# Patient Record
Sex: Female | Born: 1955 | Race: White | Hispanic: No | Marital: Married | Smoking: Never smoker
Health system: Southern US, Community
[De-identification: ages and names within clinical notes are randomized; demographics above are authoritative.]

## PROBLEM LIST (undated history)

## (undated) DIAGNOSIS — E785 Hyperlipidemia, unspecified: Secondary | ICD-10-CM

## (undated) DIAGNOSIS — R7303 Prediabetes: Secondary | ICD-10-CM

## (undated) DIAGNOSIS — F99 Mental disorder, not otherwise specified: Secondary | ICD-10-CM

## (undated) HISTORY — PX: TONSILLECTOMY: SUR1361

## (undated) HISTORY — DX: Prediabetes: R73.03

## (undated) HISTORY — DX: Mental disorder, not otherwise specified: F99

## (undated) HISTORY — DX: Hyperlipidemia, unspecified: E78.5

---

## 2005-05-01 ENCOUNTER — Ambulatory Visit: Payer: Self-pay | Admitting: Internal Medicine

## 2005-05-15 ENCOUNTER — Encounter: Payer: Self-pay | Admitting: Internal Medicine

## 2005-05-15 ENCOUNTER — Other Ambulatory Visit: Admission: RE | Admit: 2005-05-15 | Discharge: 2005-05-15 | Payer: Self-pay | Admitting: Internal Medicine

## 2005-05-15 ENCOUNTER — Ambulatory Visit: Payer: Self-pay | Admitting: Internal Medicine

## 2005-11-10 ENCOUNTER — Ambulatory Visit: Payer: Self-pay | Admitting: Internal Medicine

## 2005-12-01 ENCOUNTER — Ambulatory Visit: Payer: Self-pay | Admitting: Internal Medicine

## 2006-03-10 ENCOUNTER — Ambulatory Visit: Payer: Self-pay | Admitting: Internal Medicine

## 2006-07-13 ENCOUNTER — Ambulatory Visit: Payer: Self-pay | Admitting: Internal Medicine

## 2006-07-13 LAB — CONVERTED CEMR LAB
BUN: 9 mg/dL (ref 6–23)
Basophils Absolute: 0.1 10*3/uL (ref 0.0–0.1)
Chloride: 104 meq/L (ref 96–112)
Cholesterol: 233 mg/dL (ref 0–200)
Creatinine, Ser: 0.6 mg/dL (ref 0.4–1.2)
GFR calc Af Amer: 136 mL/min
HCT: 36.1 % (ref 36.0–46.0)
Hemoglobin: 11.9 g/dL — ABNORMAL LOW (ref 12.0–15.0)
MCV: 81.8 fL (ref 78.0–100.0)
Monocytes Absolute: 0.7 10*3/uL (ref 0.2–0.7)
Neutro Abs: 4.5 10*3/uL (ref 1.4–7.7)
Neutrophils Relative %: 62.2 % (ref 43.0–77.0)
Potassium: 3.9 meq/L (ref 3.5–5.1)
RBC: 4.41 M/uL (ref 3.87–5.11)
RDW: 14.8 % — ABNORMAL HIGH (ref 11.5–14.6)
Total CHOL/HDL Ratio: 4.6
WBC: 7.2 10*3/uL (ref 4.5–10.5)

## 2006-07-20 ENCOUNTER — Ambulatory Visit: Payer: Self-pay | Admitting: Internal Medicine

## 2007-08-15 ENCOUNTER — Telehealth: Payer: Self-pay | Admitting: Internal Medicine

## 2007-09-05 ENCOUNTER — Ambulatory Visit: Payer: Self-pay | Admitting: Internal Medicine

## 2007-09-05 DIAGNOSIS — R4182 Altered mental status, unspecified: Secondary | ICD-10-CM

## 2007-09-05 DIAGNOSIS — F329 Major depressive disorder, single episode, unspecified: Secondary | ICD-10-CM

## 2007-09-05 DIAGNOSIS — E785 Hyperlipidemia, unspecified: Secondary | ICD-10-CM | POA: Insufficient documentation

## 2007-09-05 LAB — CONVERTED CEMR LAB
Basophils Relative: 0.7 % (ref 0.0–1.0)
CO2: 27 meq/L (ref 19–32)
Creatinine, Ser: 0.6 mg/dL (ref 0.4–1.2)
Eosinophils Absolute: 0.2 10*3/uL (ref 0.0–0.6)
Eosinophils Relative: 2.3 % (ref 0.0–5.0)
GFR calc Af Amer: 135 mL/min
GFR calc non Af Amer: 112 mL/min
HCT: 39.7 % (ref 36.0–46.0)
Lymphocytes Relative: 25.8 % (ref 12.0–46.0)
MCHC: 32.9 g/dL (ref 30.0–36.0)
MCV: 85.2 fL (ref 78.0–100.0)
Potassium: 4 meq/L (ref 3.5–5.1)
RBC: 4.66 M/uL (ref 3.87–5.11)
Sodium: 141 meq/L (ref 135–145)

## 2008-01-30 ENCOUNTER — Ambulatory Visit: Payer: Self-pay | Admitting: Internal Medicine

## 2008-01-30 DIAGNOSIS — M25559 Pain in unspecified hip: Secondary | ICD-10-CM

## 2008-01-30 DIAGNOSIS — M25569 Pain in unspecified knee: Secondary | ICD-10-CM

## 2008-02-14 ENCOUNTER — Encounter: Payer: Self-pay | Admitting: Internal Medicine

## 2010-01-31 ENCOUNTER — Encounter: Admission: RE | Admit: 2010-01-31 | Discharge: 2010-01-31 | Payer: Self-pay | Admitting: Internal Medicine

## 2010-12-20 LAB — HM MAMMOGRAPHY: HM Mammogram: NORMAL

## 2010-12-20 LAB — HM COLONOSCOPY: HM Colonoscopy: NORMAL

## 2011-04-02 ENCOUNTER — Ambulatory Visit: Payer: Self-pay | Admitting: Unknown Physician Specialty

## 2011-10-08 ENCOUNTER — Encounter: Payer: Self-pay | Admitting: Obstetrics and Gynecology

## 2011-10-13 ENCOUNTER — Encounter: Payer: Self-pay | Admitting: Obstetrics and Gynecology

## 2011-10-13 ENCOUNTER — Ambulatory Visit (INDEPENDENT_AMBULATORY_CARE_PROVIDER_SITE_OTHER): Payer: No Typology Code available for payment source | Admitting: Obstetrics and Gynecology

## 2011-10-13 VITALS — BP 120/78 | Ht 66.0 in | Wt 175.0 lb

## 2011-10-13 DIAGNOSIS — N950 Postmenopausal bleeding: Secondary | ICD-10-CM

## 2011-10-13 NOTE — Progress Notes (Signed)
Subjective:    Shelly Kim is a 56 y.o. female G3P3 who presents for PMB. Menopause in 2010. Used Hormone pellets until 11/2010.  Spotting in December, January, February followed by 7 days of spotting and 6 days of menstrual-type bleeding 09/14/11 to 09/20/11.   The following portions of the patient's history were reviewed and updated as appropriate: allergies, current medications, past family history, past medical history, past social history, past surgical history and problem list.  Review of Systems A comprehensive review of systems was negative. Gastrointestinal:No change in bowel habits, no abdominal pain, no rectal bleeding Genitourinary:negative for dysuria, frequency, hematuria, nocturia and urinary incontinence    Objective:     BP 120/78  Ht 5\' 6"  (1.676 m)  Wt 175 lb (79.379 kg)  BMI 28.25 kg/m2  LMP 09/14/2011  Weight:  Wt Readings from Last 1 Encounters:  10/13/11 175 lb (79.379 kg)     BMI: Body mass index is 28.25 kg/(m^2). General Appearance: Alert, appropriate appearance for age. No acute distress HEENT: Grossly normal Neck / Thyroid: Supple, no masses, nodes or enlargement Lungs: clear to auscultation bilaterally Back: No CVA tenderness Breast Exam: No masses or nodes.No dimpling, nipple retraction or discharge. Cardiovascular: Regular rate and rhythm. S1, S2, no murmur Gastrointestinal: Soft, non-tender, no masses or organomegaly Pelvic Exam: Vulva and vagina appear normal. Bimanual exam reveals normal uterus and adnexa. Rectovaginal: not indicated Lymphatic Exam: Non-palpable nodes in neck, clavicular, axillary, or inguinal regions Skin: no rash or abnormalities Neurologic: Normal gait and speech, no tremor  Psychiatric: Alert and oriented, appropriate affect.    Urinalysis:Not done  ENDOMETRIAL BIOPSY PERFORMED DURING VISIT  INDICATION: post menopausal bleeding  LMP: 09/14/11 UPT: N/A  Ultrasound findings: TO BE SCHEDULED   Cervix prepped with  betadine Tenaculum applied to anterior lip of the cervix: yes Uterus sounded at  8  cm Endometrial biopsy easily performed with Pipelle Well tolerated  Specimen appropriately identified and sent to pathology       Assessment:    Post-menopausal vaginal bleeding    Plan:    Biopsy RESULTS PENDING. Pelvic ultrasound.   Follow-up:  WITH ULTRASOUND

## 2011-10-13 NOTE — Progress Notes (Signed)
The patient is not currently taking hormone replacement therapy The patient  is not taking a Calcium supplement. Post-menopausal bleeding:yes:  March 18,2013  Last Pap: was normal October  2012 Last mammogram: was normal September  2011 Last DEXA scan : never  Last colonoscopy:was normal January 2013  Urinary symptoms: none Normal bowel movements: Yes Reports abuse at home: No:

## 2011-10-21 ENCOUNTER — Encounter: Payer: Self-pay | Admitting: Obstetrics and Gynecology

## 2011-10-29 ENCOUNTER — Other Ambulatory Visit: Payer: No Typology Code available for payment source

## 2011-10-29 ENCOUNTER — Encounter: Payer: No Typology Code available for payment source | Admitting: Obstetrics and Gynecology

## 2011-11-03 ENCOUNTER — Ambulatory Visit (INDEPENDENT_AMBULATORY_CARE_PROVIDER_SITE_OTHER): Payer: No Typology Code available for payment source

## 2011-11-03 ENCOUNTER — Ambulatory Visit (INDEPENDENT_AMBULATORY_CARE_PROVIDER_SITE_OTHER): Payer: No Typology Code available for payment source | Admitting: Obstetrics and Gynecology

## 2011-11-03 ENCOUNTER — Other Ambulatory Visit: Payer: Self-pay | Admitting: Obstetrics and Gynecology

## 2011-11-03 VITALS — BP 106/70

## 2011-11-03 DIAGNOSIS — N950 Postmenopausal bleeding: Secondary | ICD-10-CM

## 2011-11-03 MED ORDER — MEDROXYPROGESTERONE ACETATE 10 MG PO TABS: 10.0000 mg | ORAL_TABLET | Freq: Every day | ORAL | Status: DC

## 2011-11-03 NOTE — Progress Notes (Signed)
Follow-up PMB. Discontinued hormonal pellets June 2012 Endometrial biopsy normal Ultrasound today: uterus 5.5x9.8x5.8 cm               EM 1.16 cm        Ovaries normal  A/P:  PMB with normal biopsy but thickened EM Provera 10 mg daily x 14 days then schedule sonohyst June 2013

## 2011-12-03 ENCOUNTER — Ambulatory Visit (INDEPENDENT_AMBULATORY_CARE_PROVIDER_SITE_OTHER): Payer: No Typology Code available for payment source | Admitting: Obstetrics and Gynecology

## 2011-12-03 ENCOUNTER — Encounter (INDEPENDENT_AMBULATORY_CARE_PROVIDER_SITE_OTHER): Payer: No Typology Code available for payment source

## 2011-12-03 ENCOUNTER — Other Ambulatory Visit: Payer: Self-pay | Admitting: Obstetrics and Gynecology

## 2011-12-03 ENCOUNTER — Encounter: Payer: Self-pay | Admitting: Obstetrics and Gynecology

## 2011-12-03 VITALS — BP 112/72

## 2011-12-03 DIAGNOSIS — N950 Postmenopausal bleeding: Secondary | ICD-10-CM

## 2011-12-03 DIAGNOSIS — N938 Other specified abnormal uterine and vaginal bleeding: Secondary | ICD-10-CM

## 2011-12-03 DIAGNOSIS — N949 Unspecified condition associated with female genital organs and menstrual cycle: Secondary | ICD-10-CM

## 2011-12-03 MED ORDER — PROGESTERONE MICRONIZED 200 MG PO CAPS: ORAL_CAPSULE | ORAL | Status: DC

## 2011-12-03 NOTE — Progress Notes (Signed)
Sonohysterogram procedure  Consent signed Pelvic ultrasound: Normal uterus, normal ovaries, endometrium measuring 0.58 cm  Indication:follow-up PMB Patient completed 14 days of Provera and started bleeding on D12 for 10 days. Reports moodiness during Provera  Prepping with Betadine Tenaculum used: no 3-D sweep obtained easily  Findings: normal with EM 0.58 cm  Previous laboratory results reviewed:yes  Plan: prometrium 200 mg x 12 days every 60 days PRN          F-U AEX   Silverio Lay MD

## 2011-12-03 NOTE — Progress Notes (Signed)
Sonohyst:  Endometrium:  0.598 cm  AV uterus  No focal lesions identified  3D imaging confirmation

## 2011-12-20 LAB — HM PAP SMEAR: HM Pap smear: NORMAL

## 2012-07-27 ENCOUNTER — Ambulatory Visit: Payer: No Typology Code available for payment source | Admitting: Obstetrics and Gynecology

## 2012-12-19 ENCOUNTER — Ambulatory Visit (INDEPENDENT_AMBULATORY_CARE_PROVIDER_SITE_OTHER): Payer: No Typology Code available for payment source | Admitting: Internal Medicine

## 2012-12-19 ENCOUNTER — Encounter: Payer: Self-pay | Admitting: Internal Medicine

## 2012-12-19 VITALS — BP 100/88 | HR 88 | Temp 98.7°F | Ht 66.5 in | Wt 173.0 lb

## 2012-12-19 DIAGNOSIS — Z1239 Encounter for other screening for malignant neoplasm of breast: Secondary | ICD-10-CM

## 2012-12-19 DIAGNOSIS — R739 Hyperglycemia, unspecified: Secondary | ICD-10-CM | POA: Insufficient documentation

## 2012-12-19 DIAGNOSIS — Z0000 Encounter for general adult medical examination without abnormal findings: Secondary | ICD-10-CM

## 2012-12-19 DIAGNOSIS — E049 Nontoxic goiter, unspecified: Secondary | ICD-10-CM

## 2012-12-19 DIAGNOSIS — K5900 Constipation, unspecified: Secondary | ICD-10-CM | POA: Insufficient documentation

## 2012-12-19 DIAGNOSIS — R7309 Other abnormal glucose: Secondary | ICD-10-CM

## 2012-12-19 LAB — LIPID PANEL
HDL: 49.7 mg/dL (ref >39.00)
Total CHOL/HDL Ratio: 4
VLDL: 24.2 mg/dL (ref 0.0–40.0)

## 2012-12-19 LAB — COMPREHENSIVE METABOLIC PANEL
ALT: 17 U/L (ref 0–35)
AST: 19 U/L (ref 0–37)
Albumin: 4.3 g/dL (ref 3.5–5.2)
Chloride: 106 mEq/L (ref 96–112)
Potassium: 3.9 mEq/L (ref 3.5–5.1)
Total Bilirubin: 0.7 mg/dL (ref 0.3–1.2)

## 2012-12-19 LAB — CBC WITH DIFFERENTIAL/PLATELET
Basophils Absolute: 0.1 10*3/uL (ref 0.0–0.1)
HCT: 42.2 % (ref 36.0–46.0)
Lymphocytes Relative: 23.1 % (ref 12.0–46.0)
Lymphs Abs: 2.1 10*3/uL (ref 0.7–4.0)
Neutro Abs: 6 10*3/uL (ref 1.4–7.7)
Neutrophils Relative %: 67 % (ref 43.0–77.0)
RBC: 4.72 Mil/uL (ref 3.87–5.11)

## 2012-12-19 NOTE — Progress Notes (Signed)
Subjective:    Patient ID: Shelly Kim, female    DOB: Dec 29, 1955, 57 y.o.   MRN: [id number]  HPI 57YO female presents to establish care. She is concerned about enlarged thyroid. Notes that for several years her thyroid has felt large, particularly on the left side, where it is sometimes tender to palpation. Both her mother and grandmother had thyroidectomy for goiter. She notes over the last few months, her voice has become hoarse. She snores at night. She does not have trouble swallowing. She has chronic constipation but no change in weight or temperature intolerance.  She typically takes OTC laxatives if she cannot have a BM after 1 week.  Outpatient Encounter Prescriptions as of 12/19/2012  Medication Sig Dispense Refill  . meloxicam (MOBIC) 15 MG tablet       . progesterone (PROMETRIUM) 200 MG capsule 1 capsule daily for 12 days every 60 days if no spontaneous cycle.  36 capsule  4   No facility-administered encounter medications on file as of 12/19/2012.   BP 100/88  Pulse 88  Temp(Src) 98.7 F (37.1 C) (Oral)  Ht 5' 6.5" (1.689 m)  Wt 173 lb (78.472 kg)  BMI 27.51 kg/m2  SpO2 96%  LMP 09/14/2011  Review of Systems  Constitutional: Negative for fever, chills, appetite change, fatigue and unexpected weight change.  HENT: Positive for voice change. Negative for ear pain, congestion, sore throat, trouble swallowing, neck pain and sinus pressure.   Eyes: Negative for visual disturbance.  Respiratory: Negative for cough, shortness of breath, wheezing and stridor.   Cardiovascular: Negative for chest pain, palpitations and leg swelling.  Gastrointestinal: Negative for nausea, vomiting, abdominal pain, diarrhea, constipation, blood in stool, abdominal distention and anal bleeding.  Genitourinary: Negative for dysuria and flank pain.  Musculoskeletal: Negative for myalgias, arthralgias and gait problem.  Skin: Negative for color change and rash.  Neurological: Negative for dizziness  and headaches.  Hematological: Negative for adenopathy. Does not bruise/bleed easily.  Psychiatric/Behavioral: Negative for suicidal ideas, sleep disturbance and dysphoric mood. The patient is not nervous/anxious.        Objective:   Physical Exam  Constitutional: She is oriented to person, place, and time. She appears well-developed and well-nourished. No distress.  HENT:  Head: Normocephalic and atraumatic.  Right Ear: External ear normal.  Left Ear: External ear normal.  Nose: Nose normal.  Mouth/Throat: Oropharynx is clear and moist. No oropharyngeal exudate.  Eyes: Conjunctivae are normal. Pupils are equal, round, and reactive to light. Right eye exhibits no discharge. Left eye exhibits no discharge. No scleral icterus.  Neck: Normal range of motion. Neck supple. No tracheal tenderness present. No rigidity. No tracheal deviation and normal range of motion present. Thyromegaly (10cm in widest dimension, Lside fuller than right, no palpable nodules) present.  Cardiovascular: Normal rate, regular rhythm, normal heart sounds and intact distal pulses.  Exam reveals no gallop and no friction rub.   No murmur heard. Pulmonary/Chest: Effort normal and breath sounds normal. No accessory muscle usage or stridor. Not tachypneic. No respiratory distress. She has no decreased breath sounds. She has no wheezes. She has no rhonchi. She has no rales. She exhibits no tenderness.  Abdominal: Soft. Bowel sounds are normal. She exhibits no distension and no mass. There is no tenderness. There is no rebound and no guarding.  Musculoskeletal: Normal range of motion. She exhibits no edema and no tenderness.  Lymphadenopathy:    She has no cervical adenopathy.  Neurological: She is alert and oriented to person,  place, and time. No cranial nerve deficit. She exhibits normal muscle tone. Coordination normal.  Skin: Skin is warm and dry. No rash noted. She is not diaphoretic. No erythema. No pallor.   Psychiatric: She has a normal mood and affect. Her behavior is normal. Judgment and thought content normal.          Assessment & Plan:

## 2012-12-19 NOTE — Assessment & Plan Note (Signed)
Encouraged use of Miralax and adequate intake of liquids. Will check Thyroid function with labs today.

## 2012-12-19 NOTE — Assessment & Plan Note (Signed)
Will set up screening mammogram. 

## 2012-12-19 NOTE — Assessment & Plan Note (Signed)
BG elevated in the past. Will check A1c today.

## 2012-12-19 NOTE — Assessment & Plan Note (Signed)
Enlarged thyroid measuring 10cm in widest dimension by my exam. Pt symptomatic with hoarseness and pressure. Will set up US thyroid for further evaluation. Will check TSH and Free T4 with labs.

## 2012-12-20 ENCOUNTER — Encounter: Payer: Self-pay | Admitting: *Deleted

## 2012-12-21 ENCOUNTER — Telehealth: Payer: Self-pay | Admitting: Internal Medicine

## 2012-12-21 DIAGNOSIS — E042 Nontoxic multinodular goiter: Secondary | ICD-10-CM

## 2012-12-21 NOTE — Telephone Encounter (Signed)
Ultrasound of the thyroid showed diffuse enlargement of the thyroid and multinodular goiter. I would recommend that we set up ENT evaluation.

## 2012-12-22 NOTE — Telephone Encounter (Signed)
Patient informed and verbally agreed understanding. Would like to move forward with referral.

## 2013-01-12 ENCOUNTER — Encounter: Payer: Self-pay | Admitting: Internal Medicine

## 2013-01-19 ENCOUNTER — Encounter: Payer: Self-pay | Admitting: Internal Medicine

## 2013-01-26 ENCOUNTER — Encounter: Payer: Self-pay | Admitting: Internal Medicine

## 2013-02-09 ENCOUNTER — Ambulatory Visit: Payer: No Typology Code available for payment source | Admitting: Internal Medicine

## 2013-02-13 ENCOUNTER — Ambulatory Visit (INDEPENDENT_AMBULATORY_CARE_PROVIDER_SITE_OTHER): Payer: No Typology Code available for payment source | Admitting: Internal Medicine

## 2013-02-13 ENCOUNTER — Encounter: Payer: Self-pay | Admitting: Internal Medicine

## 2013-02-13 VITALS — BP 100/80 | HR 76 | Temp 98.4°F | Wt 169.0 lb

## 2013-02-13 DIAGNOSIS — E049 Nontoxic goiter, unspecified: Secondary | ICD-10-CM

## 2013-02-13 NOTE — Assessment & Plan Note (Signed)
Patient with goiter on exam. Status post thyroid biopsy by endocrinology. She reports pathology showed normal tissue. She will plan to follow with ENT physician for repeat ultrasound in 6 months. We will repeat TSH and free T4 at that time.

## 2013-02-13 NOTE — Progress Notes (Signed)
Subjective:    Patient ID: Shelly Kim, female    DOB: 27-Feb-1956, 57 y.o.   MRN: [id number]  HPI 57 year old female with history of goiter presents for followup. She reports she was seen by ENT physician then referred to endocrinology for thyroid biopsy. She reports that biopsy showed normal tissue. She has follow up scheduled with her ENT physician. They discussed potential thyroidectomy in the future. Recent thyroid labs including TSH and free T4 were normal. She continues to experience some mild constipation. She also experiences some hoarseness. She reports her ENT physician performed a laryngoscopy to look for vocal cord dysfunction or abnormality however this was normal.  Outpatient Encounter Prescriptions as of 02/13/2013  Medication Sig Dispense Refill  . meloxicam (MOBIC) 15 MG tablet       . progesterone (PROMETRIUM) 200 MG capsule 1 capsule daily for 12 days every 60 days if no spontaneous cycle.  36 capsule  4   No facility-administered encounter medications on file as of 02/13/2013.   BP 100/80  Pulse 76  Temp(Src) 98.4 F (36.9 C) (Oral)  Wt 169 lb (76.658 kg)  BMI 26.87 kg/m2  SpO2 95%  LMP 09/14/2011  Review of Systems  Constitutional: Negative for fever, chills, appetite change, fatigue and unexpected weight change.  HENT: Negative for ear pain, congestion, sore throat, trouble swallowing, neck pain, voice change and sinus pressure.   Eyes: Negative for visual disturbance.  Respiratory: Negative for cough, shortness of breath, wheezing and stridor.   Cardiovascular: Negative for chest pain, palpitations and leg swelling.  Gastrointestinal: Negative for nausea, vomiting, abdominal pain, diarrhea, constipation, blood in stool, abdominal distention and anal bleeding.  Genitourinary: Negative for dysuria and flank pain.  Musculoskeletal: Negative for myalgias, arthralgias and gait problem.  Skin: Negative for color change and rash.  Neurological: Negative for dizziness  and headaches.  Hematological: Negative for adenopathy. Does not bruise/bleed easily.  Psychiatric/Behavioral: Negative for suicidal ideas, sleep disturbance and dysphoric mood. The patient is not nervous/anxious.        Objective:   Physical Exam  Constitutional: She is oriented to person, place, and time. She appears well-developed and well-nourished. No distress.  HENT:  Head: Normocephalic and atraumatic.  Right Ear: External ear normal.  Left Ear: External ear normal.  Nose: Nose normal.  Mouth/Throat: Oropharynx is clear and moist. No oropharyngeal exudate.  Eyes: Conjunctivae are normal. Pupils are equal, round, and reactive to light. Right eye exhibits no discharge. Left eye exhibits no discharge. No scleral icterus.  Neck: Normal range of motion. Neck supple. No tracheal deviation present. Thyromegaly present.  Cardiovascular: Normal rate, regular rhythm, normal heart sounds and intact distal pulses.  Exam reveals no gallop and no friction rub.   No murmur heard. Pulmonary/Chest: Effort normal and breath sounds normal. No accessory muscle usage. Not tachypneic. No respiratory distress. She has no decreased breath sounds. She has no wheezes. She has no rhonchi. She has no rales. She exhibits no tenderness.  Musculoskeletal: Normal range of motion. She exhibits no edema and no tenderness.  Lymphadenopathy:    She has no cervical adenopathy.  Neurological: She is alert and oriented to person, place, and time. No cranial nerve deficit. She exhibits normal muscle tone. Coordination normal.  Skin: Skin is warm and dry. No rash noted. She is not diaphoretic. No erythema. No pallor.  Psychiatric: She has a normal mood and affect. Her behavior is normal. Judgment and thought content normal.          Assessment &  Plan:

## 2013-05-04 ENCOUNTER — Other Ambulatory Visit: Payer: Self-pay

## 2013-05-11 ENCOUNTER — Encounter: Payer: Self-pay | Admitting: Internal Medicine

## 2013-05-11 ENCOUNTER — Ambulatory Visit (INDEPENDENT_AMBULATORY_CARE_PROVIDER_SITE_OTHER): Payer: No Typology Code available for payment source | Admitting: Internal Medicine

## 2013-05-11 VITALS — BP 110/80 | HR 83 | Temp 98.2°F | Ht 66.5 in | Wt 174.5 lb

## 2013-05-11 DIAGNOSIS — F339 Major depressive disorder, recurrent, unspecified: Secondary | ICD-10-CM

## 2013-05-11 DIAGNOSIS — Z23 Encounter for immunization: Secondary | ICD-10-CM

## 2013-05-11 MED ORDER — FLUOXETINE HCL 20 MG PO TABS: 20.0000 mg | ORAL_TABLET | Freq: Every day | ORAL | Status: DC

## 2013-05-11 NOTE — Progress Notes (Signed)
  Subjective:    Patient ID: Shelly Kim, female    DOB: 1955-07-17, 57 y.o.   MRN: [id number]  HPI 56YO female with h/o depression presents for acute visit. Over last several months, has had symptoms of depressed mood, frequent crying, lack of interest in usual activities, increased sleep, increased appetite. Notes similar symptoms in the past for which she used Zoloft with some improvement. Denies suicidal ideation. Has not had counseling or tried any OTC meds for this.  Outpatient Prescriptions Prior to Visit  Medication Sig Dispense Refill  . meloxicam (MOBIC) 15 MG tablet       . progesterone (PROMETRIUM) 200 MG capsule 1 capsule daily for 12 days every 60 days if no spontaneous cycle.  36 capsule  4   No facility-administered medications prior to visit.   BP 110/80  Pulse 83  Temp(Src) 98.2 F (36.8 C) (Oral)  Ht 5' 6.5" (1.689 m)  Wt 174 lb 8 oz (79.153 kg)  BMI 27.75 kg/m2  SpO2 96%  LMP 09/14/2011  Review of Systems  Constitutional: Negative for fever, chills and fatigue.  Respiratory: Negative for shortness of breath.   Cardiovascular: Negative for chest pain.  Gastrointestinal: Negative for abdominal pain.  Psychiatric/Behavioral: Positive for behavioral problems, sleep disturbance and dysphoric mood. Negative for suicidal ideas. The patient is not nervous/anxious.        Objective:   Physical Exam  Constitutional: She is oriented to person, place, and time. She appears well-developed and well-nourished. No distress.  HENT:  Head: Normocephalic and atraumatic.  Right Ear: External ear normal.  Left Ear: External ear normal.  Nose: Nose normal.  Mouth/Throat: Oropharynx is clear and moist. No oropharyngeal exudate.  Eyes: Conjunctivae are normal. Pupils are equal, round, and reactive to light. Right eye exhibits no discharge. Left eye exhibits no discharge. No scleral icterus.  Neck: Normal range of motion. Neck supple. No tracheal deviation present. No thyromegaly  present.  Cardiovascular: Normal rate, regular rhythm, normal heart sounds and intact distal pulses.  Exam reveals no gallop and no friction rub.   No murmur heard. Pulmonary/Chest: Effort normal and breath sounds normal. No accessory muscle usage. Not tachypneic. No respiratory distress. She has no decreased breath sounds. She has no wheezes. She has no rhonchi. She has no rales. She exhibits no tenderness.  Musculoskeletal: Normal range of motion. She exhibits no edema and no tenderness.  Lymphadenopathy:    She has no cervical adenopathy.  Neurological: She is alert and oriented to person, place, and time. No cranial nerve deficit. She exhibits normal muscle tone. Coordination normal.  Skin: Skin is warm and dry. No rash noted. She is not diaphoretic. No erythema. No pallor.  Psychiatric: Her speech is normal and behavior is normal. Judgment and thought content normal. Thought content is not paranoid. Cognition and memory are normal. She exhibits a depressed mood. She expresses no homicidal and no suicidal ideation. She expresses no suicidal plans and no homicidal plans.          Assessment & Plan:

## 2013-05-11 NOTE — Patient Instructions (Signed)
Email me with follow up in 1-2 weeks.

## 2013-05-11 NOTE — Assessment & Plan Note (Signed)
Symptoms consistent with severe recurrent depression. Will start Fluoxetine 20mg  daily. Pt will call if any problems with this medication. We also discussed setting up counseling, however she would like to wait until new insurance in place next year. Follow up 4 weeks and prn.  Over of which >50% spent in face-to-face contact with patient discussing plan of care

## 2013-05-11 NOTE — Progress Notes (Signed)
Pre-visit discussion using our clinic review tool. No additional management support is needed unless otherwise documented below in the visit note.  

## 2013-06-08 ENCOUNTER — Ambulatory Visit (INDEPENDENT_AMBULATORY_CARE_PROVIDER_SITE_OTHER): Payer: No Typology Code available for payment source | Admitting: Internal Medicine

## 2013-06-08 ENCOUNTER — Encounter: Payer: Self-pay | Admitting: Internal Medicine

## 2013-06-08 VITALS — BP 130/90 | HR 86 | Temp 97.9°F | Ht 66.5 in | Wt 176.0 lb

## 2013-06-08 DIAGNOSIS — F339 Major depressive disorder, recurrent, unspecified: Secondary | ICD-10-CM

## 2013-06-08 MED ORDER — FLUOXETINE HCL 20 MG PO TABS: 40.0000 mg | ORAL_TABLET | Freq: Every day | ORAL | Status: DC

## 2013-06-08 NOTE — Assessment & Plan Note (Signed)
Symptoms improved but not completely controlled on Fluoxetine. Will try increasing dose to 40mg  daily. Pt will email with update. Follow up 4 weeks and prn.

## 2013-06-08 NOTE — Progress Notes (Signed)
   Subjective:    Patient ID: Shelly Kim, female    DOB: August 17, 1955, 57 y.o.   MRN: [id number]  HPI 56YO female presents to follow up depression. Symptoms have been improved with less depressed mood on Fluoxetine. No side effects noted from medication. Over two week period, only a couple of days with symptoms of depressed mood. No suicidal ideation. Pt would like to try higher dose of medication to see if control of symptoms can be further improved. No new concerns today.  Outpatient Encounter Prescriptions as of 06/08/2013  Medication Sig  . FLUoxetine (PROZAC) 20 MG tablet Take 2 tablets (40 mg total) by mouth daily.  . Lactase (LACTOSE INTOLERANCE PO) Take by mouth daily.  . [DISCONTINUED] FLUoxetine (PROZAC) 20 MG tablet Take 1 tablet (20 mg total) by mouth daily.   BP 130/90  Pulse 86  Temp(Src) 97.9 F (36.6 C) (Oral)  Ht 5' 6.5" (1.689 m)  Wt 176 lb (79.833 kg)  BMI 27.98 kg/m2  SpO2 96%  LMP 09/14/2011  Review of Systems  Constitutional: Negative for chills, fatigue and unexpected weight change.  Eyes: Negative for visual disturbance.  Respiratory: Negative for shortness of breath.   Skin: Negative for color change and rash.  Neurological: Negative for dizziness and headaches.  Hematological: Negative for adenopathy. Does not bruise/bleed easily.  Psychiatric/Behavioral: Positive for dysphoric mood. Negative for suicidal ideas, behavioral problems and sleep disturbance. The patient is not nervous/anxious.        Objective:   Physical Exam  Constitutional: She is oriented to person, place, and time. She appears well-developed and well-nourished. No distress.  HENT:  Head: Normocephalic and atraumatic.  Right Ear: External ear normal.  Left Ear: External ear normal.  Nose: Nose normal.  Mouth/Throat: Oropharynx is clear and moist. No oropharyngeal exudate.  Eyes: Conjunctivae are normal. Pupils are equal, round, and reactive to light. Right eye exhibits no discharge.  Left eye exhibits no discharge. No scleral icterus.  Neck: Normal range of motion. Neck supple. No tracheal deviation present. No thyromegaly present.  Cardiovascular: Normal rate, regular rhythm, normal heart sounds and intact distal pulses.  Exam reveals no gallop and no friction rub.   No murmur heard. Pulmonary/Chest: Effort normal and breath sounds normal. No accessory muscle usage. Not tachypneic. No respiratory distress. She has no decreased breath sounds. She has no wheezes. She has no rhonchi. She has no rales. She exhibits no tenderness.  Musculoskeletal: Normal range of motion. She exhibits no edema and no tenderness.  Lymphadenopathy:    She has no cervical adenopathy.  Neurological: She is alert and oriented to person, place, and time. No cranial nerve deficit. She exhibits normal muscle tone. Coordination normal.  Skin: Skin is warm and dry. No rash noted. She is not diaphoretic. No erythema. No pallor.  Psychiatric: She has a normal mood and affect. Her behavior is normal. Judgment and thought content normal.          Assessment & Plan:

## 2013-06-08 NOTE — Progress Notes (Signed)
Pre-visit discussion using our clinic review tool. No additional management support is needed unless otherwise documented below in the visit note.  

## 2013-07-07 ENCOUNTER — Ambulatory Visit (INDEPENDENT_AMBULATORY_CARE_PROVIDER_SITE_OTHER): Payer: No Typology Code available for payment source | Admitting: Internal Medicine

## 2013-07-07 ENCOUNTER — Encounter: Payer: Self-pay | Admitting: Internal Medicine

## 2013-07-07 VITALS — BP 110/80 | HR 80 | Temp 98.1°F | Ht 66.5 in | Wt 172.8 lb

## 2013-07-07 DIAGNOSIS — F339 Major depressive disorder, recurrent, unspecified: Secondary | ICD-10-CM

## 2013-07-07 DIAGNOSIS — E049 Nontoxic goiter, unspecified: Secondary | ICD-10-CM

## 2013-07-07 NOTE — Assessment & Plan Note (Signed)
Symptoms much improved with increase dose of Fluoxetine. Will continue. Discussed referral for counseling, however pt would like to hold off until new insurance in place. Follow up in 11/2013 for physical exam and prn.

## 2013-07-07 NOTE — Assessment & Plan Note (Signed)
Pt debating thyroidectomy. Discussed some pros and cons of this surgery with her today. Encouraged follow up with her ENT physician for [name].

## 2013-07-07 NOTE — Progress Notes (Signed)
Pre-visit discussion using our clinic review tool. No additional management support is needed unless otherwise documented below in the visit note.  

## 2013-07-07 NOTE — Progress Notes (Signed)
   Subjective:    Patient ID: Shelly Kim, female    DOB: 01/04/1956, 58 y.o.   MRN: [id number]  HPI 58YO female with h/o depression presents for follow up. Reports significant improvement in depression since starting higher dose of Fluoxetine 40mg  daily. Reports she has struggled with depression since her 44s. Feeling best she has felt since then. Less angry. Symptoms of depressed mood resolved.  Only other concern today is goiter and question of whether to proceed with thyroidectomy. She has been discussing this with her ENT. Notes sensation of pressure in her neck but no difficulty swallowing or breathing.  Outpatient Encounter Prescriptions as of 07/07/2013  Medication Sig  . FLUoxetine (PROZAC) 20 MG tablet Take 2 tablets (40 mg total) by mouth daily.  . Lactase (LACTOSE INTOLERANCE PO) Take by mouth daily.   BP 110/80  Pulse 80  Temp(Src) 98.1 F (36.7 C) (Oral)  Ht 5' 6.5" (1.689 m)  Wt 172 lb 12 oz (78.359 kg)  BMI 27.47 kg/m2  SpO2 96%  LMP 09/14/2011  Review of Systems  Constitutional: Negative for fever, chills, appetite change, fatigue and unexpected weight change.  HENT: Negative for congestion, ear pain, sinus pressure, sore throat, trouble swallowing and voice change.   Eyes: Negative for visual disturbance.  Respiratory: Negative for cough, shortness of breath, wheezing and stridor.   Cardiovascular: Negative for chest pain, palpitations and leg swelling.  Gastrointestinal: Negative for nausea, vomiting, abdominal pain, diarrhea, constipation, blood in stool, abdominal distention and anal bleeding.  Genitourinary: Negative for dysuria and flank pain.  Musculoskeletal: Negative for arthralgias, gait problem, myalgias and neck pain.  Skin: Negative for color change and rash.  Neurological: Negative for dizziness and headaches.  Hematological: Negative for adenopathy. Does not bruise/bleed easily.  Psychiatric/Behavioral: Negative for suicidal ideas, sleep disturbance  and dysphoric mood. The patient is not nervous/anxious.        Objective:   Physical Exam  Constitutional: She is oriented to person, place, and time. She appears well-developed and well-nourished. No distress.  HENT:  Head: Normocephalic and atraumatic.  Right Ear: External ear normal.  Left Ear: External ear normal.  Nose: Nose normal.  Mouth/Throat: Oropharynx is clear and moist. No oropharyngeal exudate.  Eyes: Conjunctivae are normal. Pupils are equal, round, and reactive to light. Right eye exhibits no discharge. Left eye exhibits no discharge. No scleral icterus.  Neck: Normal range of motion. Neck supple. No tracheal deviation present. Thyromegaly present.  Cardiovascular: Normal rate, regular rhythm, normal heart sounds and intact distal pulses.  Exam reveals no gallop and no friction rub.   No murmur heard. Pulmonary/Chest: Effort normal and breath sounds normal. No accessory muscle usage. Not tachypneic. No respiratory distress. She has no decreased breath sounds. She has no wheezes. She has no rhonchi. She has no rales. She exhibits no tenderness.  Musculoskeletal: Normal range of motion. She exhibits no edema and no tenderness.  Lymphadenopathy:    She has no cervical adenopathy.  Neurological: She is alert and oriented to person, place, and time. No cranial nerve deficit. She exhibits normal muscle tone. Coordination normal.  Skin: Skin is warm and dry. No rash noted. She is not diaphoretic. No erythema. No pallor.  Psychiatric: She has a normal mood and affect. Her behavior is normal. Judgment and thought content normal.          Assessment & Plan:

## 2013-07-18 ENCOUNTER — Encounter: Payer: Self-pay | Admitting: Internal Medicine

## 2013-10-16 ENCOUNTER — Telehealth: Payer: Self-pay | Admitting: Internal Medicine

## 2013-10-16 DIAGNOSIS — F339 Major depressive disorder, recurrent, unspecified: Secondary | ICD-10-CM

## 2013-10-16 MED ORDER — FLUOXETINE HCL 20 MG PO TABS: 40.0000 mg | ORAL_TABLET | Freq: Every day | ORAL | Status: DC

## 2013-10-16 NOTE — Telephone Encounter (Signed)
Now uses [facility] on [address].

## 2013-10-16 NOTE — Telephone Encounter (Signed)
Pt needs refill on fluoxetine HCl 20 mg within the next 5 days.

## 2013-10-16 NOTE — Telephone Encounter (Signed)
Prescription sent to pharmacy per patient request. 

## 2013-12-07 ENCOUNTER — Encounter: Payer: No Typology Code available for payment source | Admitting: Internal Medicine

## 2013-12-12 ENCOUNTER — Ambulatory Visit (INDEPENDENT_AMBULATORY_CARE_PROVIDER_SITE_OTHER): Payer: Federal, State, Local not specified - PPO | Admitting: Internal Medicine

## 2013-12-12 ENCOUNTER — Encounter: Payer: Self-pay | Admitting: Internal Medicine

## 2013-12-12 ENCOUNTER — Other Ambulatory Visit (HOSPITAL_COMMUNITY)
Admission: RE | Admit: 2013-12-12 | Discharge: 2013-12-12 | Disposition: A | Discharge status: Home / Self Care | Payer: Federal, State, Local not specified - PPO | Source: Ambulatory Visit | Attending: Internal Medicine | Admitting: Internal Medicine

## 2013-12-12 VITALS — BP 106/80 | HR 90 | Temp 97.9°F | Ht 67.5 in | Wt 171.0 lb

## 2013-12-12 DIAGNOSIS — R413 Other amnesia: Secondary | ICD-10-CM

## 2013-12-12 DIAGNOSIS — E049 Nontoxic goiter, unspecified: Secondary | ICD-10-CM

## 2013-12-12 DIAGNOSIS — Z01419 Encounter for gynecological examination (general) (routine) without abnormal findings: Secondary | ICD-10-CM | POA: Insufficient documentation

## 2013-12-12 DIAGNOSIS — F339 Major depressive disorder, recurrent, unspecified: Secondary | ICD-10-CM

## 2013-12-12 DIAGNOSIS — Z1151 Encounter for screening for human papillomavirus (HPV): Secondary | ICD-10-CM | POA: Insufficient documentation

## 2013-12-12 DIAGNOSIS — Z1239 Encounter for other screening for malignant neoplasm of breast: Secondary | ICD-10-CM

## 2013-12-12 DIAGNOSIS — Z0000 Encounter for general adult medical examination without abnormal findings: Secondary | ICD-10-CM

## 2013-12-12 LAB — CBC WITH DIFFERENTIAL/PLATELET
BASOS ABS: 0 10*3/uL (ref 0.0–0.1)
BASOS PCT: 0.6 % (ref 0.0–3.0)
EOS PCT: 2.7 % (ref 0.0–5.0)
Eosinophils Absolute: 0.2 10*3/uL (ref 0.0–0.7)
HCT: 41.4 % (ref 36.0–46.0)
Hemoglobin: 13.9 g/dL (ref 12.0–15.0)
LYMPHS ABS: 1.8 10*3/uL (ref 0.7–4.0)
Lymphocytes Relative: 24 % (ref 12.0–46.0)
MCHC: 33.5 g/dL (ref 30.0–36.0)
MCV: 88.6 fl (ref 78.0–100.0)
MONO ABS: 0.6 10*3/uL (ref 0.1–1.0)
Monocytes Relative: 8 % (ref 3.0–12.0)
NEUTROS PCT: 64.7 % (ref 43.0–77.0)
Neutro Abs: 4.8 10*3/uL (ref 1.4–7.7)
PLATELETS: 327 10*3/uL (ref 150.0–400.0)
RBC: 4.68 Mil/uL (ref 3.87–5.11)
RDW: 13.8 % (ref 11.5–15.5)
WBC: 7.4 10*3/uL (ref 4.0–10.5)

## 2013-12-12 LAB — COMPREHENSIVE METABOLIC PANEL
ALBUMIN: 4.6 g/dL (ref 3.5–5.2)
ALT: 16 U/L (ref 0–35)
AST: 20 U/L (ref 0–37)
Alkaline Phosphatase: 71 U/L (ref 39–117)
BILIRUBIN TOTAL: 0.3 mg/dL (ref 0.2–1.2)
BUN: 15 mg/dL (ref 6–23)
CO2: 26 mEq/L (ref 19–32)
Calcium: 9.8 mg/dL (ref 8.4–10.5)
Chloride: 104 mEq/L (ref 96–112)
Creatinine, Ser: 0.7 mg/dL (ref 0.4–1.2)
GFR: 99.33 mL/min (ref >60.00)
Glucose, Bld: 100 mg/dL — ABNORMAL HIGH (ref 70–99)
POTASSIUM: 4.4 meq/L (ref 3.5–5.1)
SODIUM: 137 meq/L (ref 135–145)
TOTAL PROTEIN: 7.4 g/dL (ref 6.0–8.3)

## 2013-12-12 LAB — T4, FREE: FREE T4: 0.67 ng/dL (ref 0.60–1.60)

## 2013-12-12 LAB — VITAMIN B12: VITAMIN B 12: 374 pg/mL (ref 211–911)

## 2013-12-12 LAB — LIPID PANEL
CHOL/HDL RATIO: 5
CHOLESTEROL: 280 mg/dL — AB (ref 0–200)
HDL: 54.6 mg/dL (ref >39.00)
LDL CALC: 189 mg/dL — AB (ref 0–99)
NONHDL: 225.4
Triglycerides: 183 mg/dL — ABNORMAL HIGH (ref 0.0–149.0)
VLDL: 36.6 mg/dL (ref 0.0–40.0)

## 2013-12-12 LAB — TSH: TSH: 1.69 u[IU]/mL (ref 0.35–4.50)

## 2013-12-12 LAB — HEMOGLOBIN A1C: HEMOGLOBIN A1C: 5.8 % (ref 4.6–6.5)

## 2013-12-12 LAB — VITAMIN D 25 HYDROXY (VIT D DEFICIENCY, FRACTURES): VITD: 15.68 ng/mL

## 2013-12-12 NOTE — Assessment & Plan Note (Signed)
Persistent goiter. Did not follow up with ENT. Pt would like to discuss options for treatment with an endocrinologist. Will set this up. Will also check TSH and Free T4 with labs.

## 2013-12-12 NOTE — Assessment & Plan Note (Signed)
Symptoms well controlled on current medications. Will continue. 

## 2013-12-12 NOTE — Addendum Note (Signed)
Addended by: Karlene Einstein D on: 12/12/2013 11:28 AM   Modules accepted: Orders

## 2013-12-12 NOTE — Progress Notes (Signed)
Pre visit review using our clinic review tool, if applicable. No additional management support is needed unless otherwise documented below in the visit note. 

## 2013-12-12 NOTE — Progress Notes (Signed)
Subjective:    Patient ID: Shelly Kim, female    DOB: 05-03-1956, 58 y.o.   MRN: [id number]  HPI 58YO female presents for annual exam.  Goiter- Did not follow up with ENT. Would like to talk to endocrinologist about options aside from thyroidectomy.  Memory loss - eg, difficulty finding a restaurant she has been to several times or forgetting a movie she has seen a few months ago or repeatedly misspelling words. Made worse by anxiety.  Also concerned about tingling in legs, typically over feet. Feels like she has "socks on." Occasionally, interrupts sleep, but does not wake from sleep. Ongoing for a couple of months. Also having some jerking activity in arms and legs, or even in head prior to going to sleep. Only over last few months. Occurs about 5/7 nights.  Review of Systems  Constitutional: Negative for fever, chills, appetite change, fatigue and unexpected weight change.  HENT: Negative for congestion, ear pain, sinus pressure, sore throat, trouble swallowing and voice change.   Eyes: Negative for visual disturbance.  Respiratory: Negative for cough, shortness of breath, wheezing and stridor.   Cardiovascular: Negative for chest pain, palpitations and leg swelling.  Gastrointestinal: Negative for nausea, vomiting, abdominal pain, diarrhea, constipation, blood in stool, abdominal distention and anal bleeding.  Genitourinary: Negative for dysuria and flank pain.  Musculoskeletal: Negative for arthralgias, gait problem, myalgias and neck pain.  Skin: Negative for color change and rash.  Neurological: Positive for numbness. Negative for dizziness, tremors, syncope, speech difficulty, weakness and headaches.  Hematological: Negative for adenopathy. Does not bruise/bleed easily.  Psychiatric/Behavioral: Positive for confusion, sleep disturbance and decreased concentration. Negative for suicidal ideas and dysphoric mood. The patient is nervous/anxious.        Objective:    BP 106/80   Pulse 90  Temp(Src) 97.9 F (36.6 C) (Oral)  Ht 5' 7.5" (1.715 m)  Wt 171 lb (77.565 kg)  BMI 26.37 kg/m2  SpO2 97%  LMP 09/14/2011 Physical Exam  Constitutional: She is oriented to person, place, and time. She appears well-developed and well-nourished. No distress.  HENT:  Head: Normocephalic and atraumatic.  Right Ear: External ear normal.  Left Ear: External ear normal.  Nose: Nose normal.  Mouth/Throat: Oropharynx is clear and moist. No oropharyngeal exudate.  Eyes: Conjunctivae are normal. Pupils are equal, round, and reactive to light. Right eye exhibits no discharge. Left eye exhibits no discharge. No scleral icterus.  Neck: Normal range of motion. Neck supple. No tracheal deviation present. No thyromegaly present.  Cardiovascular: Normal rate, regular rhythm, normal heart sounds and intact distal pulses.  Exam reveals no gallop and no friction rub.   No murmur heard. Pulmonary/Chest: Effort normal and breath sounds normal. No respiratory distress. She has no wheezes. She has no rales. She exhibits no tenderness.  Abdominal: Soft. Bowel sounds are normal. She exhibits no distension and no mass. There is no tenderness. There is no rebound and no guarding.  Genitourinary: Rectum normal, vagina normal and uterus normal. No breast swelling, tenderness, discharge or bleeding. Pelvic exam was performed with patient supine. There is no rash, tenderness or lesion on the right labia. There is no rash, tenderness or lesion on the left labia. Uterus is not enlarged and not tender. Cervix exhibits no motion tenderness, no discharge and no friability. Right adnexum displays no mass, no tenderness and no fullness. Left adnexum displays no mass, no tenderness and no fullness. No erythema or tenderness around the vagina. No vaginal discharge found.  Musculoskeletal:  Normal range of motion. She exhibits no edema and no tenderness.  Lymphadenopathy:    She has no cervical adenopathy.  Neurological:  She is alert and oriented to person, place, and time. No cranial nerve deficit. She exhibits normal muscle tone. Coordination normal.  Skin: Skin is warm and dry. No rash noted. She is not diaphoretic. No erythema. No pallor.  Psychiatric: Her speech is normal and behavior is normal. Judgment and thought content normal. Her mood appears anxious. Cognition and memory are normal.          Assessment & Plan:   Problem List Items Addressed This Visit     Unprioritized   Goiter     Persistent goiter. Did not follow up with ENT. Pt would like to discuss options for treatment with an endocrinologist. Will set this up. Will also check TSH and Free T4 with labs.    Relevant Orders      T4, free      TSH   Major depressive disorder, recurrent     Symptoms well controlled on current medications. Will continue.    Memory loss     Pt concerned about memory loss recently. Exam is normal today. Suspect that anxiety is playing a role. Will set up cognitive testing for further evaluation. Will also check TSH and B12 with labs.    Relevant Orders      B12      Ambulatory referral to Psychology   Routine general medical examination at a health care facility - Primary     General medical exam including breast and pelvic exam normal today. PAP pending. Mammogram ordered. Colonoscopy reviewed and UTD. Immunizations UTD. Encouraged healthy diet and exercise.    Relevant Orders      CBC with Differential      Comprehensive metabolic panel      Lipid panel      Vit D  25 hydroxy (rtn osteoporosis monitoring)      HgB A1c   Screening for breast cancer   Relevant Orders      MM Digital Screening       Return in about 4 weeks (around 01/09/2014) for Recheck.

## 2013-12-12 NOTE — Assessment & Plan Note (Signed)
Pt concerned about memory loss recently. Exam is normal today. Suspect that anxiety is playing a role. Will set up cognitive testing for further evaluation. Will also check TSH and B12 with labs.

## 2013-12-12 NOTE — Patient Instructions (Addendum)
You are doing well.  Continue healthy diet and regular exercise with goal of 49min 3x per week.  We will set up referrals to endocrinology and psychology for cognitive testing.

## 2013-12-12 NOTE — Assessment & Plan Note (Signed)
General medical exam including breast and pelvic exam normal today. PAP pending. Mammogram ordered. Colonoscopy reviewed and UTD. Immunizations UTD. Encouraged healthy diet and exercise.

## 2013-12-15 LAB — CYTOLOGY - PAP

## 2013-12-18 ENCOUNTER — Encounter: Payer: Self-pay | Admitting: Internal Medicine

## 2014-01-15 ENCOUNTER — Other Ambulatory Visit: Payer: Self-pay | Admitting: Internal Medicine

## 2014-01-16 ENCOUNTER — Telehealth: Payer: Self-pay | Admitting: Internal Medicine

## 2014-01-16 NOTE — Telephone Encounter (Signed)
LMTCT/msn

## 2014-01-30 ENCOUNTER — Ambulatory Visit: Payer: Federal, State, Local not specified - PPO | Admitting: Internal Medicine

## 2014-04-24 ENCOUNTER — Other Ambulatory Visit: Payer: Self-pay | Admitting: Internal Medicine

## 2014-04-30 ENCOUNTER — Encounter: Payer: Self-pay | Admitting: Internal Medicine

## 2014-06-26 ENCOUNTER — Ambulatory Visit (INDEPENDENT_AMBULATORY_CARE_PROVIDER_SITE_OTHER): Payer: Federal, State, Local not specified - PPO | Admitting: Internal Medicine

## 2014-06-26 ENCOUNTER — Encounter: Payer: Self-pay | Admitting: Internal Medicine

## 2014-06-26 VITALS — BP 118/64 | HR 84 | Temp 98.3°F | Wt 169.0 lb

## 2014-06-26 DIAGNOSIS — R05 Cough: Secondary | ICD-10-CM

## 2014-06-26 DIAGNOSIS — J069 Acute upper respiratory infection, unspecified: Secondary | ICD-10-CM

## 2014-06-26 DIAGNOSIS — R059 Cough, unspecified: Secondary | ICD-10-CM

## 2014-06-26 MED ORDER — AZITHROMYCIN 250 MG PO TABS: ORAL_TABLET | ORAL | Status: DC

## 2014-06-26 NOTE — Progress Notes (Signed)
Pre visit review using our clinic review tool, if applicable. No additional management support is needed unless otherwise documented below in the visit note. 

## 2014-06-26 NOTE — Progress Notes (Signed)
HPI  Pt presents to the clinic today with c/o cough. She reports this started 3 weeks ago. The cough is productive of yellow mucous at times. She has run low grade fevers but denies chills or body aches. She denies other URI symptoms. She has tried Nyquil and Delsym without much relief. She has no history of allergies or breathing problems. She has had sick contacts.   Review of Systems      Past Medical History  Diagnosis Date  . Mental disorder depression  . Prediabetes     Family History  Problem Relation Age of Onset  . Cancer Mother 49    ovarian  . Diabetes Father   . Stroke Father   . Diabetes Brother   . Hypertension Brother   . Cancer Paternal Aunt   . Cancer Paternal Uncle   . Cancer Paternal Grandmother     ovarian    History   Social History  . Marital Status: Married    Spouse Name: N/A    Number of Children: N/A  . Years of Education: N/A   Occupational History  . Not on file.   Social History Main Topics  . Smoking status: Never Smoker   . Smokeless tobacco: Never Used  . Alcohol Use: No  . Drug Use: No  . Sexual Activity: Yes    Birth Control/ Protection: None   Other Topics Concern  . Not on file   Social History Narrative   Lives in [place] with husband and daughter and [place] granddaughter. From [place], moved 9 years ago.      Work - previously Nutritional therapist, stay-at-home mom      Diet - regular diet, [employer]      Exercise - limited    Allergies  Allergen Reactions  . Cymbalta [Duloxetine Hcl]     angioedema  . Penicillins      Constitutional: Positive headacheand fever. Denies fatigue abrupt weight changes.  HEENT:  Denies eye redness, eye pain, pressure behind the eyes, facial pain, nasal congestion, ear pain, ringing in the ears, wax buildup, runny nose or sore throat. Respiratory: Positive cough. Denies difficulty breathing or shortness of breath.  Cardiovascular: Denies chest pain, chest tightness, palpitations  or swelling in the hands or feet.   No other specific complaints in a complete review of systems (except as listed in HPI above).  Objective:   BP 118/64 mmHg  Pulse 84  Temp(Src) 98.3 F (36.8 C) (Oral)  Wt 169 lb (76.658 kg)  SpO2 98%  LMP 09/14/2011    Wt Readings from Last 3 Encounters:  06/26/14 169 lb (76.658 kg)  12/12/13 171 lb (77.565 kg)  07/07/13 172 lb 12 oz (78.359 kg)     General: Appears her stated age, well developed, well nourished in NAD. HEENT: Head: normal shape and size, no sinus tenderness noted; Eyes: sclera white, no icterus, conjunctiva pink; Ears: Tm's gray and intact, normal light reflex; Nose: mucosa pink and moist, septum midline; Throat/Mouth: Teeth present, mucosa erythematous and moist, no exudate noted, no lesions or ulcerations noted.  Neck: No lymphadenopathy.   Cardiovascular: Normal rate and rhythm. S1,S2 noted.  No murmur, rubs or gallops noted.  Pulmonary/Chest: Normal effort and positive vesicular breath sounds. No respiratory distress. No wheezes, rales or ronchi noted.      Assessment & Plan:   Upper Respiratory Infection with cough:  Get some rest and drink plenty of water eRx for Azithromax x 5 days Continue Delsym or Nyquil for  cough  RTC as needed or if symptoms persist.

## 2014-06-26 NOTE — Patient Instructions (Signed)
Cough, Adult  A cough is a reflex that helps clear your throat and airways. It can help heal the body or may be a reaction to an irritated airway. A cough may only last 2 or 3 weeks (acute) or may last more than 8 weeks (chronic).  CAUSES Acute cough:  Viral or bacterial infections. Chronic cough:  Infections.  Allergies.  Asthma.  Post-nasal drip.  Smoking.  Heartburn or acid reflux.  Some medicines.  Chronic lung problems (COPD).  Cancer. SYMPTOMS   Cough.  Fever.  Chest pain.  Increased breathing rate.  High-pitched whistling sound when breathing (wheezing).  Colored mucus that you cough up (sputum). TREATMENT   A bacterial cough may be treated with antibiotic medicine.  A viral cough must run its course and will not respond to antibiotics.  Your caregiver may recommend other treatments if you have a chronic cough. HOME CARE INSTRUCTIONS   Only take over-the-counter or prescription medicines for pain, discomfort, or fever as directed by your caregiver. Use cough suppressants only as directed by your caregiver.  Use a cold steam vaporizer or humidifier in your bedroom or home to help loosen secretions.  Sleep in a semi-upright position if your cough is worse at night.  Rest as needed.  Stop smoking if you smoke. SEEK IMMEDIATE MEDICAL CARE IF:   You have pus in your sputum.  Your cough starts to worsen.  You cannot control your cough with suppressants and are losing sleep.  You begin coughing up blood.  You have difficulty breathing.  You develop pain which is getting worse or is uncontrolled with medicine.  You have a fever. MAKE SURE YOU:   Understand these instructions.  Will watch your condition.  Will get help right away if you are not doing well or get worse. Document Released: 12/12/2010 Document Revised: 09/07/2011 Document Reviewed: 12/12/2010 Exit[employer] Patient Information 2015 [employer]. This information is not intended  to replace advice given to you by your health care provider. Make sure you discuss any questions you have with your health care provider.  

## 2014-07-23 ENCOUNTER — Other Ambulatory Visit: Payer: Self-pay | Admitting: Internal Medicine

## 2014-08-19 ENCOUNTER — Other Ambulatory Visit: Payer: Self-pay | Admitting: Internal Medicine

## 2014-08-20 NOTE — Telephone Encounter (Signed)
Last OV with PCP 6/15 ok to fill?

## 2014-10-31 ENCOUNTER — Encounter: Payer: Self-pay | Admitting: Internal Medicine

## 2015-01-09 ENCOUNTER — Ambulatory Visit (INDEPENDENT_AMBULATORY_CARE_PROVIDER_SITE_OTHER): Payer: Federal, State, Local not specified - PPO | Admitting: Internal Medicine

## 2015-01-09 ENCOUNTER — Encounter: Payer: Self-pay | Admitting: Internal Medicine

## 2015-01-09 VITALS — BP 79/52 | HR 88 | Temp 98.4°F | Ht 67.5 in | Wt 167.5 lb

## 2015-01-09 DIAGNOSIS — F331 Major depressive disorder, recurrent, moderate: Secondary | ICD-10-CM | POA: Diagnosis not present

## 2015-01-09 DIAGNOSIS — E049 Nontoxic goiter, unspecified: Secondary | ICD-10-CM

## 2015-01-09 DIAGNOSIS — M792 Neuralgia and neuritis, unspecified: Secondary | ICD-10-CM | POA: Diagnosis not present

## 2015-01-09 DIAGNOSIS — R5382 Chronic fatigue, unspecified: Secondary | ICD-10-CM | POA: Insufficient documentation

## 2015-01-09 DIAGNOSIS — L509 Urticaria, unspecified: Secondary | ICD-10-CM | POA: Diagnosis not present

## 2015-01-09 DIAGNOSIS — Z1239 Encounter for other screening for malignant neoplasm of breast: Secondary | ICD-10-CM

## 2015-01-09 NOTE — Assessment & Plan Note (Signed)
Persistent goiter. Did not follow up with ENT. Will set up endocrinology evaluation. Check TSH and Free T4 with labs. Will get sleep study as question if goiter may be causing OSA.

## 2015-01-09 NOTE — Patient Instructions (Signed)
Labs today  We will set up mammogram and sleep study.  We will set up referrals to: Dr. Buddy Duty - Endocrinology Dr. Tomi Likens - Neurology

## 2015-01-09 NOTE — Progress Notes (Signed)
Pre visit review using our clinic review tool, if applicable. No additional management support is needed unless otherwise documented below in the visit note. 

## 2015-01-09 NOTE — Assessment & Plan Note (Signed)
Chronic fatigue. Given goiter and snoring history, question OSA. Will set up sleep study. Will check CBC, CMP, TSH, B12 with labs.

## 2015-01-09 NOTE — Assessment & Plan Note (Signed)
Mammogram ordered

## 2015-01-09 NOTE — Assessment & Plan Note (Signed)
Unclear cause of intermittent urticaria. Will check thyroid function, liver and kidney function today. She is on numerous supplemental medications, and these may be contributing. If symptoms persist, discussed referral to allergy/immunology.

## 2015-01-09 NOTE — Progress Notes (Addendum)
Subjective:    Patient ID: Shelly Kim, female    DOB: 1955/12/16, 59 y.o.   MRN: [id number]  HPI  59YO female presents for follow up. Last seen 6/15 for physical exam.  Neuropathy - Having burning pain in legs. Ongoing for about 1 year. Seems to be worsening. Not taking anything for pain.  Goiter - Would like to see an endocrinologist. Feels tired most of the time. Has trouble with focusing. Question if low thyroid function causing this. No dyspnea. No trouble swallowing. Notes some snoring at night.  Has had 5 weeks of random hives over body. Questions if this is related to thyroid. No changes in diet, shampoos, lotions, soaps.    Past medical, surgical, family and social history per today's encounter.  Review of Systems  Constitutional: Positive for fatigue. Negative for fever, chills, appetite change and unexpected weight change.  Eyes: Negative for visual disturbance.  Respiratory: Negative for shortness of breath.   Cardiovascular: Negative for chest pain and leg swelling.  Gastrointestinal: Negative for nausea, vomiting, abdominal pain, diarrhea and constipation.  Musculoskeletal: Positive for myalgias. Negative for arthralgias.  Skin: Positive for rash. Negative for color change.  Hematological: Negative for adenopathy. Does not bruise/bleed easily.  Psychiatric/Behavioral: Positive for sleep disturbance. Negative for dysphoric mood. The patient is not nervous/anxious.        Objective:    BP 79/52 mmHg  Pulse 88  Temp(Src) 98.4 F (36.9 C) (Oral)  Ht 5' 7.5" (1.715 m)  Wt 167 lb 8 oz (75.978 kg)  BMI 25.83 kg/m2  SpO2 100%  LMP 09/14/2011 Physical Exam  Constitutional: She is oriented to person, place, and time. She appears well-developed and well-nourished. No distress.  HENT:  Head: Normocephalic and atraumatic.  Right Ear: External ear normal.  Left Ear: External ear normal.  Nose: Nose normal.  Mouth/Throat: Oropharynx is clear and moist. No  oropharyngeal exudate.  Eyes: Conjunctivae are normal. Pupils are equal, round, and reactive to light. Right eye exhibits no discharge. Left eye exhibits no discharge. No scleral icterus.  Neck: Normal range of motion. Neck supple. Carotid bruit is not present. No tracheal deviation present. Thyroid mass (nodular) and thyromegaly present.  Cardiovascular: Normal rate, regular rhythm, normal heart sounds and intact distal pulses.  Exam reveals no gallop and no friction rub.   No murmur heard. Pulmonary/Chest: Effort normal and breath sounds normal. No respiratory distress. She has no wheezes. She has no rales. She exhibits no tenderness.  Musculoskeletal: Normal range of motion. She exhibits no edema or tenderness.  Lymphadenopathy:    She has no cervical adenopathy.  Neurological: She is alert and oriented to person, place, and time. She displays no atrophy and no tremor. No cranial nerve deficit or sensory deficit. She exhibits normal muscle tone. Coordination and gait normal.  Reflex Scores:      Patellar reflexes are 2+ on the right side and 2+ on the left side. Skin: Skin is warm and dry. No rash noted. She is not diaphoretic. No erythema. No pallor.  Psychiatric: She has a normal mood and affect. Her behavior is normal. Judgment and thought content normal.          Assessment & Plan:   Problem List Items Addressed This Visit      Unprioritized   Chronic fatigue    Chronic fatigue. Given goiter and snoring history, question OSA. Will set up sleep study. Will check CBC, CMP, TSH, B12 with labs.      Relevant Orders  Ambulatory referral to Sleep Studies   Goiter - Primary    Persistent goiter. Did not follow up with ENT. Will set up endocrinology evaluation. Check TSH and Free T4 with labs. Will get sleep study as question if goiter may be causing OSA.      Relevant Orders   TSH   T4, free   Ambulatory referral to Endocrinology   Thyroid antibodies   Major depressive  disorder, recurrent    Symptoms well controlled on Fluoxetine. Will continue.      Neuropathic pain    One year h/o neuropathic pain. Will check basic labs including CMP, CBC, B12, TSH. Will set up neurology evaluation for possible EMG testing.      Relevant Orders   CBC with Differential/Platelet   Comprehensive metabolic panel   Z66   Ambulatory referral to Neurology   Screening for breast cancer    Mammogram ordered.      Relevant Orders   MM Digital Screening   Urticaria    Unclear cause of intermittent urticaria. Will check thyroid function, liver and kidney function today. She is on numerous supplemental medications, and these may be contributing. If symptoms persist, discussed referral to allergy/immunology.          Return in about 4 weeks (around 02/06/2015) for Recheck.

## 2015-01-09 NOTE — Assessment & Plan Note (Signed)
Symptoms well controlled on Fluoxetine. Will continue.

## 2015-01-09 NOTE — Assessment & Plan Note (Signed)
One year h/o neuropathic pain. Will check basic labs including CMP, CBC, B12, TSH. Will set up neurology evaluation for possible EMG testing.

## 2015-01-10 LAB — COMPREHENSIVE METABOLIC PANEL
ALBUMIN: 4.3 g/dL (ref 3.5–5.2)
ALT: 18 U/L (ref 0–35)
AST: 22 U/L (ref 0–37)
Alkaline Phosphatase: 72 U/L (ref 39–117)
BILIRUBIN TOTAL: 0.4 mg/dL (ref 0.2–1.2)
BUN: 15 mg/dL (ref 6–23)
CO2: 29 mEq/L (ref 19–32)
Calcium: 9.9 mg/dL (ref 8.4–10.5)
Chloride: 104 mEq/L (ref 96–112)
Creatinine, Ser: 0.63 mg/dL (ref 0.40–1.20)
GFR: 102.59 mL/min (ref >60.00)
GLUCOSE: 96 mg/dL (ref 70–99)
Potassium: 3.9 mEq/L (ref 3.5–5.1)
Sodium: 140 mEq/L (ref 135–145)
TOTAL PROTEIN: 7.2 g/dL (ref 6.0–8.3)

## 2015-01-10 LAB — CBC WITH DIFFERENTIAL/PLATELET
Basophils Absolute: 0 10*3/uL (ref 0.0–0.1)
Basophils Relative: 0.3 % (ref 0.0–3.0)
EOS ABS: 0.2 10*3/uL (ref 0.0–0.7)
Eosinophils Relative: 2.1 % (ref 0.0–5.0)
HEMATOCRIT: 39.7 % (ref 36.0–46.0)
Hemoglobin: 13.3 g/dL (ref 12.0–15.0)
LYMPHS ABS: 2.6 10*3/uL (ref 0.7–4.0)
LYMPHS PCT: 27.8 % (ref 12.0–46.0)
MCHC: 33.6 g/dL (ref 30.0–36.0)
MCV: 88.9 fl (ref 78.0–100.0)
MONOS PCT: 7.6 % (ref 3.0–12.0)
Monocytes Absolute: 0.7 10*3/uL (ref 0.1–1.0)
NEUTROS ABS: 5.8 10*3/uL (ref 1.4–7.7)
NEUTROS PCT: 62.2 % (ref 43.0–77.0)
PLATELETS: 325 10*3/uL (ref 150.0–400.0)
RBC: 4.47 Mil/uL (ref 3.87–5.11)
RDW: 13.9 % (ref 11.5–15.5)
WBC: 9.3 10*3/uL (ref 4.0–10.5)

## 2015-01-10 LAB — T4, FREE: Free T4: 0.73 ng/dL (ref 0.60–1.60)

## 2015-01-10 LAB — THYROID ANTIBODIES
Thyroglobulin Ab: 3 IU/mL — ABNORMAL HIGH (ref <2)
Thyroperoxidase Ab SerPl-aCnc: 21 IU/mL — ABNORMAL HIGH (ref <9)

## 2015-01-10 LAB — TSH: TSH: 1.19 u[IU]/mL (ref 0.35–4.50)

## 2015-01-10 LAB — VITAMIN B12: Vitamin B-12: 507 pg/mL (ref 211–911)

## 2015-01-31 ENCOUNTER — Telehealth: Payer: Self-pay | Admitting: Internal Medicine

## 2015-01-31 NOTE — Telephone Encounter (Signed)
Sleep study showed severe obstructive sleep apnea. I would recommend starting Auto-titrating CPAP. We can send in a prescription for this to a company of her choice. For auto-titrating CPAP 4-20cm H2O

## 2015-02-01 NOTE — Telephone Encounter (Signed)
Pt will call back with the company she would like to use

## 2015-02-07 ENCOUNTER — Ambulatory Visit: Payer: Federal, State, Local not specified - PPO | Admitting: Internal Medicine

## 2015-02-12 ENCOUNTER — Ambulatory Visit (INDEPENDENT_AMBULATORY_CARE_PROVIDER_SITE_OTHER): Payer: Federal, State, Local not specified - PPO | Admitting: Internal Medicine

## 2015-02-12 ENCOUNTER — Encounter: Payer: Self-pay | Admitting: Internal Medicine

## 2015-02-12 VITALS — BP 113/77 | HR 84 | Temp 98.1°F | Ht 67.5 in | Wt 169.0 lb

## 2015-02-12 DIAGNOSIS — G4733 Obstructive sleep apnea (adult) (pediatric): Secondary | ICD-10-CM

## 2015-02-12 DIAGNOSIS — R5382 Chronic fatigue, unspecified: Secondary | ICD-10-CM

## 2015-02-12 DIAGNOSIS — L509 Urticaria, unspecified: Secondary | ICD-10-CM | POA: Diagnosis not present

## 2015-02-12 NOTE — Progress Notes (Signed)
Subjective:    Patient ID: Shelly Kim, female    DOB: 01/06/1956, 59 y.o.   MRN: [id number]  HPI  59YO female presents for follow up.  Last seen 7/13 with fatigue. Labs were normal, however sleep study showed sleep apnea.  OSA - Sleep study showed severe OSA. She has not yet started CPAP. Looking into insurance coverage for this.  Urticaria - Having hives on occasion. No clear trigger. Occurred on head and stomach. No new lotions, soaps, perfumes. Not using anything for this. Followed by dermatology, Dr. Kellie Moor.    Past Medical History  Diagnosis Date  . Mental disorder depression  . Prediabetes    Family History  Problem Relation Age of Onset  . Cancer Mother 49    ovarian  . Diabetes Father   . Stroke Father   . Diabetes Brother   . Hypertension Brother   . Cancer Paternal Aunt   . Cancer Paternal Uncle   . Cancer Paternal Grandmother     ovarian   Past Surgical History  Procedure Laterality Date  . Tonsillectomy    . Vaginal delivery      3   Social History   Social History  . Marital Status: Married    Spouse Name: N/A  . Number of Children: N/A  . Years of Education: N/A   Social History Main Topics  . Smoking status: Never Smoker   . Smokeless tobacco: Never Used  . Alcohol Use: No  . Drug Use: No  . Sexual Activity: Yes    Birth Control/ Protection: None   Other Topics Concern  . None   Social History Narrative   Lives in [place] with husband and daughter and [place] granddaughter. From [place], moved 9 years ago.      Work - previously Nutritional therapist, stay-at-home mom      Diet - regular diet, [employer]      Exercise - limited    Review of Systems  Constitutional: Positive for fatigue. Negative for fever, chills, appetite change and unexpected weight change.  HENT: Negative for trouble swallowing.   Eyes: Negative for visual disturbance.  Respiratory: Negative for shortness of breath.   Cardiovascular: Negative for chest  pain and leg swelling.  Gastrointestinal: Negative for abdominal pain.  Skin: Positive for color change and rash.  Hematological: Negative for adenopathy. Does not bruise/bleed easily.  Psychiatric/Behavioral: Negative for dysphoric mood. The patient is not nervous/anxious.        Objective:    BP 113/77 mmHg  Pulse 84  Temp(Src) 98.1 F (36.7 C) (Oral)  Ht 5' 7.5" (1.715 m)  Wt 169 lb (76.658 kg)  BMI 26.06 kg/m2  SpO2 97%  LMP 09/14/2011 Physical Exam  Constitutional: She is oriented to person, place, and time. She appears well-developed and well-nourished. No distress.  HENT:  Head: Normocephalic and atraumatic.  Right Ear: External ear normal.  Left Ear: External ear normal.  Nose: Nose normal.  Mouth/Throat: Oropharynx is clear and moist. No oropharyngeal exudate.  Eyes: Conjunctivae are normal. Pupils are equal, round, and reactive to light. Right eye exhibits no discharge. Left eye exhibits no discharge. No scleral icterus.  Neck: Normal range of motion. Neck supple. Carotid bruit is not present. No tracheal deviation present. Thyromegaly present. No thyroid mass present.  Cardiovascular: Normal rate, regular rhythm, normal heart sounds and intact distal pulses.  Exam reveals no gallop and no friction rub.   No murmur heard. Pulmonary/Chest: Effort normal and breath sounds normal. No  respiratory distress. She has no wheezes. She has no rales. She exhibits no tenderness.  Musculoskeletal: Normal range of motion. She exhibits no edema or tenderness.  Lymphadenopathy:    She has no cervical adenopathy.  Neurological: She is alert and oriented to person, place, and time. No cranial nerve deficit. She exhibits normal muscle tone. Coordination normal.  Skin: Skin is warm and dry. No rash noted. She is not diaphoretic. No erythema. No pallor.  Psychiatric: She has a normal mood and affect. Her behavior is normal. Judgment and thought content normal.          Assessment &  Plan:   Problem List Items Addressed This Visit      Unprioritized   Chronic fatigue - Primary    Symptoms of mild fatigue unchanged. Labs were normal. Sleep study showed sleep apnea. Will set up auto-titrating CPAP.      OSA (obstructive sleep apnea)    Recent sleep study showed severe sleep apnea. Will start auto-titrating CPAP at home. Follow up 3 months and prn.      Urticaria    Recent urticaria. Discussed potential causes of this and treatment. She will keep a diary of symptoms and plan to follow up with dermatology.          Return in about 3 months (around 05/15/2015) for Recheck.

## 2015-02-12 NOTE — Assessment & Plan Note (Signed)
Symptoms of mild fatigue unchanged. Labs were normal. Sleep study showed sleep apnea. Will set up auto-titrating CPAP.

## 2015-02-12 NOTE — Assessment & Plan Note (Signed)
Recent sleep study showed severe sleep apnea. Will start auto-titrating CPAP at home. Follow up 3 months and prn.

## 2015-02-12 NOTE — Progress Notes (Signed)
Pre visit review using our clinic review tool, if applicable. No additional management support is needed unless otherwise documented below in the visit note. 

## 2015-02-12 NOTE — Patient Instructions (Signed)
Sleep Apnea  Sleep apnea is a sleep disorder characterized by abnormal pauses in breathing while you sleep. When your breathing pauses, the level of oxygen in your blood decreases. This causes you to move out of deep sleep and into light sleep. As a result, your quality of sleep is poor, and the system that carries your blood throughout your body (cardiovascular system) experiences stress. If sleep apnea remains untreated, the following conditions can develop:  High blood pressure (hypertension).  Coronary artery disease.  Inability to achieve or maintain an erection (impotence).  Impairment of your thought process (cognitive dysfunction). There are three types of sleep apnea: 1. Obstructive sleep apnea--Pauses in breathing during sleep because of a blocked airway. 2. Central sleep apnea--Pauses in breathing during sleep because the area of the brain that controls your breathing does not send the correct signals to the muscles that control breathing. 3. Mixed sleep apnea--A combination of both obstructive and central sleep apnea. RISK FACTORS The following risk factors can increase your risk of developing sleep apnea:  Being overweight.  Smoking.  Having narrow passages in your nose and throat.  Being of older age.  Being female.  Alcohol use.  Sedative and tranquilizer use.  Ethnicity. Among individuals younger than 35 years, African Americans are at increased risk of sleep apnea. SYMPTOMS   Difficulty staying asleep.  Daytime sleepiness and fatigue.  Loss of energy.  Irritability.  Loud, heavy snoring.  Morning headaches.  Trouble concentrating.  Forgetfulness.  Decreased interest in sex. DIAGNOSIS  In order to diagnose sleep apnea, your caregiver will perform a physical examination. Your caregiver may suggest that you take a home sleep test. Your caregiver may also recommend that you spend the night in a sleep lab. In the sleep lab, several monitors record  information about your heart, lungs, and brain while you sleep. Your leg and arm movements and blood oxygen level are also recorded. TREATMENT The following actions may help to resolve mild sleep apnea:  Sleeping on your side.   Using a decongestant if you have nasal congestion.   Avoiding the use of depressants, including alcohol, sedatives, and narcotics.   Losing weight and modifying your diet if you are overweight. There also are devices and treatments to help open your airway:  Oral appliances. These are custom-made mouthpieces that shift your lower jaw forward and slightly open your bite. This opens your airway.  Devices that create positive airway pressure. This positive pressure "splints" your airway open to help you breathe better during sleep. The following devices create positive airway pressure:  Continuous positive airway pressure (CPAP) device. The CPAP device creates a continuous level of air pressure with an air pump. The air is delivered to your airway through a mask while you sleep. This continuous pressure keeps your airway open.  Nasal expiratory positive airway pressure (EPAP) device. The EPAP device creates positive air pressure as you exhale. The device consists of single-use valves, which are inserted into each nostril and held in place by adhesive. The valves create very little resistance when you inhale but create much more resistance when you exhale. That increased resistance creates the positive airway pressure. This positive pressure while you exhale keeps your airway open, making it easier to breath when you inhale again.  Bilevel positive airway pressure (BPAP) device. The BPAP device is used mainly in patients with central sleep apnea. This device is similar to the CPAP device because it also uses an air pump to deliver continuous air pressure   through a mask. However, with the BPAP machine, the pressure is set at two different levels. The pressure when you  exhale is lower than the pressure when you inhale.  Surgery. Typically, surgery is only done if you cannot comply with less invasive treatments or if the less invasive treatments do not improve your condition. Surgery involves removing excess tissue in your airway to create a wider passage way. Document Released: 06/05/2002 Document Revised: 10/10/2012 Document Reviewed: 10/22/2011 Exit[employer] Patient Information 2015 [employer]. This information is not intended to replace advice given to you by your health care provider. Make sure you discuss any questions you have with your health care provider.  

## 2015-02-12 NOTE — Assessment & Plan Note (Signed)
Recent urticaria. Discussed potential causes of this and treatment. She will keep a diary of symptoms and plan to follow up with dermatology.

## 2015-02-13 ENCOUNTER — Ambulatory Visit
Admission: RE | Admit: 2015-02-13 | Discharge: 2015-02-13 | Disposition: A | Discharge status: Home / Self Care | Payer: Federal, State, Local not specified - PPO | Source: Ambulatory Visit | Attending: Internal Medicine | Admitting: Internal Medicine

## 2015-02-13 DIAGNOSIS — Z1239 Encounter for other screening for malignant neoplasm of breast: Secondary | ICD-10-CM

## 2015-02-15 ENCOUNTER — Telehealth: Payer: Self-pay | Admitting: *Deleted

## 2015-02-15 NOTE — Telephone Encounter (Signed)
Almyra Free - Can you set this up?

## 2015-02-15 NOTE — Telephone Encounter (Signed)
Pt called states she wants her CPap be ordered from [facility].

## 2015-02-19 ENCOUNTER — Telehealth: Payer: Self-pay | Admitting: *Deleted

## 2015-02-19 NOTE — Telephone Encounter (Signed)
[place] called requesting clarification as to whether Dr Gilford Rile is wanting oral titration or in lab study.  She states it is unclear on paperwork submitted.  Please advise

## 2015-02-19 NOTE — Telephone Encounter (Signed)
Order and all needed info faxed to [facility]

## 2015-02-19 NOTE — Telephone Encounter (Signed)
Spoke with [place], advised of MDs message.

## 2015-02-19 NOTE — Telephone Encounter (Signed)
It was my understanding that we were going to start auto-titrating CPAP?

## 2015-02-20 ENCOUNTER — Ambulatory Visit: Payer: Federal, State, Local not specified - PPO | Admitting: Endocrinology

## 2015-03-08 ENCOUNTER — Ambulatory Visit (INDEPENDENT_AMBULATORY_CARE_PROVIDER_SITE_OTHER): Payer: Federal, State, Local not specified - PPO | Admitting: Endocrinology

## 2015-03-08 ENCOUNTER — Encounter: Payer: Self-pay | Admitting: Endocrinology

## 2015-03-08 VITALS — BP 110/78 | HR 94 | Temp 97.8°F | Resp 16 | Ht 67.0 in | Wt 168.7 lb

## 2015-03-08 DIAGNOSIS — E041 Nontoxic single thyroid nodule: Secondary | ICD-10-CM | POA: Diagnosis not present

## 2015-03-08 NOTE — Patient Instructions (Signed)
Please have the ultrasound rechecked.  you will receive a phone call, about a day and time for an appointment. Because you have anti-thyroid antibodies, you are at risk for an underactive thyroid.  Therefore, please have your thyroid checked with each annual checkup. I would be happy to see you back here as necessary.

## 2015-03-08 NOTE — Progress Notes (Signed)
Subjective:    Patient ID: Shelly Kim, female    DOB: 06-21-1956, 59 y.o.   MRN: [id number]  HPI Pt was noted to have a nodule at the thyroid in 2014.  she is unaware of ever having had thyroid problems in the past.  she has no h/o XRT or surgery to the neck.  She has moderate weight gain throughout the body, and assoc deepening of the voice.  She feels she has hypothyroidism, despite normal labs.   Past Medical History  Diagnosis Date  . Mental disorder depression  . Prediabetes     Past Surgical History  Procedure Laterality Date  . Tonsillectomy    . Vaginal delivery      3    Social History   Social History  . Marital Status: Married    Spouse Name: N/A  . Number of Children: N/A  . Years of Education: N/A   Occupational History  . Not on file.   Social History Main Topics  . Smoking status: Never Smoker   . Smokeless tobacco: Never Used  . Alcohol Use: No  . Drug Use: No  . Sexual Activity: Yes    Birth Control/ Protection: None   Other Topics Concern  . Not on file   Social History Narrative   Lives in [place] with husband and daughter and [place] granddaughter. From [place], moved 9 years ago.      Work - previously Nutritional therapist, stay-at-home mom      Diet - regular diet, [employer]      Exercise - limited    Current Outpatient Prescriptions on File Prior to Visit  Medication Sig Dispense Refill  . Cholecalciferol (VITAMIN D3) 2000 UNITS TABS Take by mouth.    Marland Kitchen FLUoxetine (PROZAC) 20 MG tablet TAKE 2 TABLETS BY MOUTH EVERY DAY 60 tablet 0  . Lactase (LACTOSE INTOLERANCE PO) Take by mouth daily.    . Multiple Vitamin (MULTIVITAMIN) tablet Take 1 tablet by mouth daily.    . Omega-3 Fatty Acids (FISH OIL) 1000 MG CAPS Take 1 capsule by mouth daily.    . Probiotic Product (PROBIOTIC DAILY PO) Take by mouth.     No current facility-administered medications on file prior to visit.    Allergies  Allergen Reactions  . Cymbalta [Duloxetine  Hcl]     angioedema  . Penicillins     Family History  Problem Relation Age of Onset  . Cancer Mother 6    ovarian  . Diabetes Father   . Stroke Father   . Diabetes Brother   . Hypertension Brother   . Cancer Paternal Aunt   . Cancer Paternal Uncle   . Cancer Paternal Grandmother     ovarian  . Thyroid disease Maternal Grandmother   . Thyroid disease Mother   . Thyroid disease Cousin     BP 110/78 mmHg  Pulse 94  Temp(Src) 97.8 F (36.6 C)  Resp 16  Ht 5\' 7"  (1.702 m)  Wt 168 lb 11.2 oz (76.522 kg)  BMI 26.42 kg/m2  SpO2 95%  LMP 09/14/2011   Review of Systems  HENT: Negative for trouble swallowing.   Respiratory: Negative for shortness of breath.   Cardiovascular: Positive for leg swelling.  Gastrointestinal: Positive for constipation.  Endocrine: Negative for cold intolerance.  Musculoskeletal: Negative for gait problem.  Skin:       She reports hair loss  Neurological: Negative for numbness.  Psychiatric/Behavioral: Positive for dysphoric mood and decreased concentration.  she  has fatigue     Objective:   Physical Exam VS: see vs page GEN: no distress HEAD: head: no deformity eyes: no periorbital swelling, no proptosis external nose and ears are normal mouth: no lesion seen NECK: thyroid is slightly enlarged, but I can't tell details CHEST WALL: no deformity LUNGS:  Clear to auscultation CV: reg rate and rhythm, no murmur ABD: abdomen is soft, nontender.  no hepatosplenomegaly.  not distended.  no hernia MUSCULOSKELETAL: muscle bulk and strength are grossly normal.  no obvious joint swelling.  gait is normal and steady EXTEMITIES: no deformity.  no ulcer on the feet.  feet are of normal color and temp.  no edema PULSES: dorsalis pedis intact bilat.  no carotid bruit NEURO:  cn 2-12 grossly intact.   readily moves all 4's.  sensation is intact to touch on the feet.  No tremor SKIN:  Normal texture and temperature.  No rash or suspicious lesion is  visible.   NODES:  None palpable at the neck PSYCH: alert, well-oriented.  Does not appear anxious nor depressed.  Lab Results  Component Value Date   TSH 1.19 01/09/2015    Radiol: thyroid US (12/20/12): small multinodular goiter  Thyroid antibodies: pos.    Assessment & Plan:  Multinodular goiter, usually hereditary, due for recheck Chronic thyroiditis: this may be the cause of goiter: she is at risk for the eventual development of hypothyroidism.   Weight gain, and other sxs: I told pt these are not thyroid-related.  Patient is advised the following: Patient Instructions  Please have the ultrasound rechecked.  you will receive a phone call, about a day and time for an appointment. Because you have anti-thyroid antibodies, you are at risk for an underactive thyroid.  Therefore, please have your thyroid checked with each annual checkup. I would be happy to see you back here as necessary.

## 2015-03-12 ENCOUNTER — Other Ambulatory Visit: Payer: Self-pay | Admitting: Internal Medicine

## 2015-03-13 ENCOUNTER — Ambulatory Visit
Admission: RE | Admit: 2015-03-13 | Discharge: 2015-03-13 | Disposition: A | Discharge status: Home / Self Care | Payer: Federal, State, Local not specified - PPO | Source: Ambulatory Visit | Attending: Endocrinology | Admitting: Endocrinology

## 2015-03-13 DIAGNOSIS — E041 Nontoxic single thyroid nodule: Secondary | ICD-10-CM

## 2015-03-13 DIAGNOSIS — E042 Nontoxic multinodular goiter: Secondary | ICD-10-CM | POA: Diagnosis not present

## 2015-03-20 ENCOUNTER — Ambulatory Visit (INDEPENDENT_AMBULATORY_CARE_PROVIDER_SITE_OTHER): Payer: Federal, State, Local not specified - PPO | Admitting: Neurology

## 2015-03-20 ENCOUNTER — Encounter: Payer: Self-pay | Admitting: Neurology

## 2015-03-20 VITALS — BP 110/80 | HR 79 | Ht 67.0 in | Wt 166.4 lb

## 2015-03-20 DIAGNOSIS — H8112 Benign paroxysmal vertigo, left ear: Secondary | ICD-10-CM

## 2015-03-20 DIAGNOSIS — R42 Dizziness and giddiness: Secondary | ICD-10-CM

## 2015-03-20 DIAGNOSIS — H8110 Benign paroxysmal vertigo, unspecified ear: Secondary | ICD-10-CM | POA: Insufficient documentation

## 2015-03-20 DIAGNOSIS — R202 Paresthesia of skin: Secondary | ICD-10-CM | POA: Diagnosis not present

## 2015-03-20 NOTE — Patient Instructions (Addendum)
1.  US carotids 2.  EMG of the legs 3.  Start vestibular therapy 4.  Return to clinic in 2-3 months

## 2015-03-20 NOTE — Progress Notes (Signed)
[facility] Clinic Note - Initial Visit   Date: 03/20/2015  [name] MRN: [id number] DOB: 1955/11/01   Dear Dr. Gilford Rile:  Thank you for your kind referral of Shelly Kim for consultation of bilateral leg pain. Although her history is well known to you, please allow Shelly Kim to reiterate it for the purpose of our medical record. The patient was accompanied to the clinic by self.    History of Present Illness: Shelly Kim is a 59 y.o. right-handed Caucasian female with depression, prediabetes, diet-controlled hyperlipidemia presenting for evaluation of bilateral leg pain.    Starting around 2015, she began having discomfort in her legs which would keep her awake at night.  Symptoms are worse at night. It is described as tingling sensation which started in her feet and gradually also involved her lower legs to her knees.  She is unaware of tingling during the day.  Rest does not trigger symptoms and there is no benefit with movement.  No identifiable triggers, alleviating, or exacerbating factors.  Balance is not as good as it used to be and she endorses stumbling about once per month.  She fell a few weeks ago and tripped by vacuuming.    A month ago, she had two spells where she was laying in bed and developed abrupt room-spinning sensation and severe nasuea.  She had three spells since then all which are triggered by head movement to the left, each lasting about 1-3 minutes.  No associated headaches.   Out-side paper records, electronic medical record, and images have been reviewed where available and summarized as:  Lab Results  Component Value Date   VITAMINB12 507 01/09/2015   Lab Results  Component Value Date   TSH 1.19 01/09/2015   Lab Results  Component Value Date   HGBA1C 5.8 12/12/2013     Past Medical History  Diagnosis Date  . Mental disorder depression  . Prediabetes     Past Surgical History  Procedure Laterality Date  .  Tonsillectomy    . Vaginal delivery      3     Medications:  Outpatient Encounter Prescriptions as of 03/20/2015  Medication Sig  . Cholecalciferol (VITAMIN D3) 2000 UNITS TABS Take by mouth.  Marland Kitchen FLUoxetine (PROZAC) 20 MG tablet TAKE 2 TABLETS BY MOUTH EVERY DAY  . Lactase (LACTOSE INTOLERANCE PO) Take by mouth daily.  . Multiple Vitamin (MULTIVITAMIN) tablet Take 1 tablet by mouth daily.  . Omega-3 Fatty Acids (FISH OIL) 1000 MG CAPS Take 1 capsule by mouth daily.  . Probiotic Product (PROBIOTIC DAILY PO) Take by mouth.  . [DISCONTINUED] FLUoxetine (PROZAC) 20 MG tablet TAKE 2 TABLETS BY MOUTH EVERY DAY   No facility-administered encounter medications on file as of 03/20/2015.     Allergies:  Allergies  Allergen Reactions  . Cymbalta [Duloxetine Hcl]     angioedema  . Penicillins     Family History: Family History  Problem Relation Age of Onset  . Cancer Mother 34    ovarian  . Diabetes Father   . Stroke Father   . Diabetes Brother   . Hypertension Brother   . Cancer Paternal Aunt   . Cancer Paternal Uncle   . Cancer Paternal Grandmother     ovarian  . Thyroid disease Maternal Grandmother   . Thyroid disease Mother   . Thyroid disease Cousin     Social History: Social History  Substance Use Topics  . Smoking status: Never Smoker   . Smokeless tobacco:  Never Used  . Alcohol Use: No   Social History   Social History Narrative   Lives in [place] with husband and daughter and [place] granddaughter. From [place], moved 9 years ago.      Work - previously Nutritional therapist, stay-at-home mom      Diet - regular diet, [employer]      Exercise - limited    Review of Systems:  CONSTITUTIONAL: No fevers, chills, night sweats, or weight loss.   EYES: No visual changes or eye pain ENT: No hearing changes.  No history of nose bleeds.   RESPIRATORY: No cough, wheezing and shortness of breath.   CARDIOVASCULAR: Negative for chest pain, and palpitations.   GI:  Negative for abdominal discomfort, blood in stools or black stools.  No recent change in bowel habits.   GU:  No history of incontinence.   MUSCLOSKELETAL: No history of joint pain or swelling.  No myalgias.   SKIN: Negative for lesions, rash, and itching.   HEMATOLOGY/ONCOLOGY: Negative for prolonged bleeding, bruising easily, and swollen nodes.  No history of cancer.   ENDOCRINE: Negative for cold or heat intolerance, polydipsia or goiter.   PSYCH:  +depression or anxiety symptoms.   NEURO: As Above.   Vital Signs:  BP 110/80 mmHg  Pulse 79  Ht 5\' 7"  (1.702 m)  Wt 166 lb 7 oz (75.496 kg)  BMI 26.06 kg/m2  SpO2 98%  LMP 09/14/2011 Pain Scale: 0 on a scale of 0-10   General Medical Exam:   General:  Well appearing, comfortable.   Eyes/ENT: see cranial nerve examination.   Neck: No masses appreciated.  Full range of motion without tenderness.  No carotid bruits. Respiratory:  Clear to auscultation, good air entry bilaterally.   Cardiac:  Regular rate and rhythm, no murmur.   Extremities:  No deformities, edema, or skin discoloration.  Skin:  No rashes or lesions.  Neurological Exam: MENTAL STATUS including orientation to time, place, person, recent and remote memory, attention span and concentration, language, and fund of knowledge is normal.  Speech is not dysarthric.  CRANIAL NERVES: II:  No visual field defects.  Unremarkable fundi.   III-IV-VI: Pupils equal round and reactive to light.  Normal conjugate, extra-ocular eye movements in all directions of gaze.  Bilateral end-gaze nystagmus.  No ptosis.   V:  Normal facial sensation.     VII:  Normal facial symmetry and movements.   VIII:  Normal hearing and vestibular function.  Head thrust negative.  IX-X:  Normal palatal movement.   XI:  Normal shoulder shrug and head rotation.   XII:  Normal tongue strength and range of motion, no deviation or fasciculation.  MOTOR:  No atrophy, fasciculations or abnormal movements.  No  pronator drift.  Tone is normal.    Right Upper Extremity:    Left Upper Extremity:    Deltoid  5/5   Deltoid  5/5   Biceps  5/5   Biceps  5/5   Triceps  5/5   Triceps  5/5   Wrist extensors  5/5   Wrist extensors  5/5   Wrist flexors  5/5   Wrist flexors  5/5   Finger extensors  5/5   Finger extensors  5/5   Finger flexors  5/5   Finger flexors  5/5   Dorsal interossei  5/5   Dorsal interossei  5/5   Abductor pollicis  5/5   Abductor pollicis  5/5   Tone (Ashworth scale)  0  Tone (Ashworth scale)  0   Right Lower Extremity:    Left Lower Extremity:    Hip flexors  5/5   Hip flexors  5/5   Hip extensors  5/5   Hip extensors  5/5   Knee flexors  5/5   Knee flexors  5/5   Knee extensors  5/5   Knee extensors  5/5   Dorsiflexors  5/5   Dorsiflexors  5/5   Plantarflexors  5/5   Plantarflexors  5/5   Toe extensors  5/5   Toe extensors  5/5   Toe flexors  5/5   Toe flexors  5/5   Tone (Ashworth scale)  0  Tone (Ashworth scale)  0   MSRs:  Right                                                                 Left brachioradialis 2+  brachioradialis 2+  biceps 2+  biceps 2+  triceps 2+  triceps 2+  patellar 2+  patellar 2+  ankle jerk 2+  ankle jerk 2+  Hoffman no  Hoffman no  plantar response down  plantar response down   SENSORY:  Normal and symmetric perception of light touch, pinprick, vibration, and proprioception.  Romberg's sign absent.   COORDINATION/GAIT: Normal finger-to- nose-finger and heel-to-shin.  Intact rapid alternating movements bilaterally.  Able to rise from a chair without using arms.  Gait narrow based and stable. Tandem and stressed gait intact.    IMPRESSION: Mrs. Benbrook is a 59 year-old female presenting for evaluation of bilateral lower leg paresthesias and spells of dizziness.  Her leg symptoms are atypical for neuropathy, especially as it involves the entire lower legs in the setting of a normal neurological exam.  Restless leg syndrome is a consideration,  but there is no worsening at rest or improvement with activity.  She will have EMG of the legs done to better characterize the nature of her symptoms.  We had a lengthy discussion regarding neuropathy as this is her primary concern.  Her dizzy spells are most consistent with benign paroxysmal positional vertigo. She does have end-gaze nystagmus with negative head thrust testing.  Meclizine was declined as spells are too brief.  She is interested in starting vestibular therapy.  For completeness, Shelly Kim carotids will be checked.  Return to clinic in 2-3 months.   The duration of this appointment visit was 40 minutes of face-to-face time with the patient.  Greater than 50% of this time was spent in counseling, explanation of diagnosis, planning of further management, and coordination of care.   Thank you for allowing me to participate in patient's care.  If I can answer any additional questions, I would be pleased to do so.    Sincerely,    Donika K. Posey Pronto, DO

## 2015-03-22 ENCOUNTER — Inpatient Hospital Stay (HOSPITAL_COMMUNITY): Admission: RE | Admit: 2015-03-22 | Payer: Federal, State, Local not specified - PPO | Source: Ambulatory Visit

## 2015-03-27 ENCOUNTER — Inpatient Hospital Stay (HOSPITAL_COMMUNITY): Admission: RE | Admit: 2015-03-27 | Payer: Federal, State, Local not specified - PPO | Source: Ambulatory Visit

## 2015-04-02 ENCOUNTER — Encounter: Payer: Federal, State, Local not specified - PPO | Admitting: Neurology

## 2015-04-10 ENCOUNTER — Other Ambulatory Visit: Payer: Self-pay | Admitting: Internal Medicine

## 2015-05-16 ENCOUNTER — Ambulatory Visit (INDEPENDENT_AMBULATORY_CARE_PROVIDER_SITE_OTHER): Payer: Federal, State, Local not specified - PPO | Admitting: Internal Medicine

## 2015-05-16 ENCOUNTER — Encounter: Payer: Self-pay | Admitting: Internal Medicine

## 2015-05-16 VITALS — BP 112/76 | HR 84 | Temp 98.0°F | Ht 67.5 in | Wt 173.0 lb

## 2015-05-16 DIAGNOSIS — Z0000 Encounter for general adult medical examination without abnormal findings: Secondary | ICD-10-CM

## 2015-05-16 DIAGNOSIS — R413 Other amnesia: Secondary | ICD-10-CM

## 2015-05-16 DIAGNOSIS — F339 Major depressive disorder, recurrent, unspecified: Secondary | ICD-10-CM

## 2015-05-16 DIAGNOSIS — E041 Nontoxic single thyroid nodule: Secondary | ICD-10-CM | POA: Diagnosis not present

## 2015-05-16 DIAGNOSIS — G4733 Obstructive sleep apnea (adult) (pediatric): Secondary | ICD-10-CM

## 2015-05-16 DIAGNOSIS — Z23 Encounter for immunization: Secondary | ICD-10-CM | POA: Diagnosis not present

## 2015-05-16 MED ORDER — FLUOXETINE HCL 20 MG PO TABS: 40.0000 mg | ORAL_TABLET | Freq: Every day | ORAL | Status: DC

## 2015-05-16 NOTE — Progress Notes (Signed)
Pre visit review using our clinic review tool, if applicable. No additional management support is needed unless otherwise documented below in the visit note. 

## 2015-05-16 NOTE — Assessment & Plan Note (Signed)
She has stopped CPAP. She understands risks of untreated sleep apnea.

## 2015-05-16 NOTE — Assessment & Plan Note (Signed)
Symptoms are well controlled with Fluoxetine. Will continue.

## 2015-05-16 NOTE — Assessment & Plan Note (Signed)
Reviewed notes from Dr. Loanne Drilling and recent US showing multinodular goiter. Recent TSH was normal. Discussed that her symptoms are most likely not related to thyroid dysfunction. Discussed risks of taking thyroid medication in the absence of hypothyroidism. Discussed possible referral to Dr. Luna Fuse at [facility] for second opinion.

## 2015-05-16 NOTE — Progress Notes (Signed)
Subjective:    Patient ID: Shelly Kim, female    DOB: 1955-11-17, 59 y.o.   MRN: [id number]  HPI  59YO female presents for follow up.  Depression - Symptoms well controlled on Fluoxetine.  Fatigue - Stopped using CPAP because of dizziness and disrupted sleep. Fatigue has improved.  BPPV - resolved with head maneuvers done at home. Dizziness recurred when started on CPAP. Stopped CPAP  Went to endocrinologist about thyroid nodule. Feels that thyroid dysfunction is being missed. Notes memory loss, fatigue at times, weight gain which she attributes to thyroid function. Questions if she needs synthroid. Several relatives with thyroid dysfunction. [name] of thyroid in 02/2015 showed multinodular goiter.  Wt Readings from Last 3 Encounters:  05/16/15 173 lb (78.472 kg)  03/20/15 166 lb 7 oz (75.496 kg)  03/08/15 168 lb 11.2 oz (76.522 kg)   BP Readings from Last 3 Encounters:  05/16/15 112/76  03/20/15 110/80  03/08/15 110/78    Past Medical History  Diagnosis Date  . Mental disorder depression  . Prediabetes    Family History  Problem Relation Age of Onset  . Cancer Mother 78    ovarian  . Diabetes Father   . Stroke Father     Deceased  . Diabetes Brother   . Hypertension Brother   . Cancer Paternal Aunt   . Cancer Paternal Uncle   . Cancer Paternal Grandmother     ovarian  . Thyroid disease Maternal Grandmother   . Thyroid disease Mother   . Thyroid disease Cousin   . Healthy Son   . Healthy Daughter    Past Surgical History  Procedure Laterality Date  . Tonsillectomy    . Vaginal delivery      3   Social History   Social History  . Marital Status: Married    Spouse Name: N/A  . Number of Children: N/A  . Years of Education: N/A   Social History Main Topics  . Smoking status: Never Smoker   . Smokeless tobacco: Never Used  . Alcohol Use: No  . Drug Use: No  . Sexual Activity: Yes    Birth Control/ Protection: None   Other Topics Concern  . None    Social History Narrative   Lives in [place] with husband and daughter and Lostant granddaughter.      From [place], moved 9 years ago.      Work - previously Nutritional therapist, stay-at-home mom      Diet - regular diet, [employer]      Exercise - limited    Review of Systems  Constitutional: Positive for fatigue. Negative for fever, chills, appetite change and unexpected weight change.  Eyes: Negative for visual disturbance.  Respiratory: Negative for shortness of breath.   Cardiovascular: Negative for chest pain and leg swelling.  Gastrointestinal: Negative for vomiting, abdominal pain, diarrhea and constipation.  Musculoskeletal: Negative for myalgias and arthralgias.  Skin: Negative for color change and rash.  Hematological: Negative for adenopathy. Does not bruise/bleed easily.  Psychiatric/Behavioral: Positive for decreased concentration. Negative for sleep disturbance and dysphoric mood. The patient is not nervous/anxious.        Objective:    BP 112/76 mmHg  Pulse 84  Temp(Src) 98 F (36.7 C) (Oral)  Ht 5' 7.5" (1.715 m)  Wt 173 lb (78.472 kg)  BMI 26.68 kg/m2  SpO2 100%  LMP 09/14/2011 Physical Exam  Constitutional: She is oriented to person, place, and time. She appears well-developed and well-nourished. No distress.  HENT:  Head: Normocephalic and atraumatic.  Right Ear: External ear normal.  Left Ear: External ear normal.  Nose: Nose normal.  Mouth/Throat: Oropharynx is clear and moist. No oropharyngeal exudate.  Eyes: Conjunctivae are normal. Pupils are equal, round, and reactive to light. Right eye exhibits no discharge. Left eye exhibits no discharge. No scleral icterus.  Neck: Normal range of motion. Neck supple. No tracheal deviation present. No thyromegaly present.  Cardiovascular: Normal rate, regular rhythm, normal heart sounds and intact distal pulses.  Exam reveals no gallop and no friction rub.   No murmur heard. Pulmonary/Chest: Effort  normal and breath sounds normal. No respiratory distress. She has no wheezes. She has no rales. She exhibits no tenderness.  Musculoskeletal: Normal range of motion. She exhibits no edema or tenderness.  Lymphadenopathy:    She has no cervical adenopathy.  Neurological: She is alert and oriented to person, place, and time. No cranial nerve deficit. She exhibits normal muscle tone. Coordination normal.  Skin: Skin is warm and dry. No rash noted. She is not diaphoretic. No erythema. No pallor.  Psychiatric: She has a normal mood and affect. Her behavior is normal. Judgment and thought content normal.          Assessment & Plan:   Problem List Items Addressed This Visit      Unprioritized   Major depressive disorder, recurrent ([place]) - Primary    Symptoms are well controlled with Fluoxetine. Will continue.      Relevant Medications   FLUoxetine (PROZAC) 20 MG tablet   Memory loss    Discussed potential causes of memory dysfunction. Recent TSH and B12 were normal. Discussed cognitive testing at [facility].      OSA (obstructive sleep apnea)    She has stopped CPAP. She understands risks of untreated sleep apnea.      Routine general medical examination at a health care facility   Relevant Orders   CBC with Differential/Platelet   Comprehensive metabolic panel   Lipid panel   VITAMIN D 25 Hydroxy (Vit-D Deficiency, Fractures)   TSH   Thyroid nodule    Reviewed notes from Dr. Loanne Drilling and recent US showing multinodular goiter. Recent TSH was normal. Discussed that her symptoms are most likely not related to thyroid dysfunction. Discussed risks of taking thyroid medication in the absence of hypothyroidism. Discussed possible referral to Dr. Luna Fuse at [facility] for second opinion.       Other Visit Diagnoses    Encounter for immunization            Return in about 6 months (around 11/13/2015) for Physical.

## 2015-05-16 NOTE — Assessment & Plan Note (Signed)
Discussed potential causes of memory dysfunction. Recent TSH and B12 were normal. Discussed cognitive testing at [facility].

## 2015-05-16 NOTE — Patient Instructions (Signed)
Labs and physical in 10/2015.

## 2015-06-13 ENCOUNTER — Other Ambulatory Visit: Payer: Self-pay | Admitting: Internal Medicine

## 2015-07-19 ENCOUNTER — Ambulatory Visit: Payer: Federal, State, Local not specified - PPO | Admitting: Neurology

## 2015-07-24 ENCOUNTER — Encounter: Payer: Self-pay | Admitting: Internal Medicine

## 2015-07-26 ENCOUNTER — Ambulatory Visit (INDEPENDENT_AMBULATORY_CARE_PROVIDER_SITE_OTHER): Payer: Federal, State, Local not specified - PPO | Admitting: Neurology

## 2015-07-26 ENCOUNTER — Encounter: Payer: Self-pay | Admitting: Neurology

## 2015-07-26 VITALS — BP 104/68 | HR 99 | Ht 67.0 in | Wt 175.2 lb

## 2015-07-26 DIAGNOSIS — R202 Paresthesia of skin: Secondary | ICD-10-CM | POA: Diagnosis not present

## 2015-07-26 NOTE — Patient Instructions (Addendum)
NCS/EMG of the legs

## 2015-07-26 NOTE — Progress Notes (Signed)
Follow-up Visit   Date: 07/26/2015    Shelly Kim MRN: [id number] DOB: Jan 02, 1956   Interim History: Shelly Kim is a 60 y.o. right-handed Caucasian female with depression, prediabetes, diet-controlled hyperlipidemia returning to the clinic for follow-up of bilateral leg pain.  The patient was accompanied to the clinic by self.  History of present illness: Starting around 2015, she began having discomfort in her legs which would keep her awake at night. Symptoms are worse at night. It is described as tingling sensation which started in her feet and gradually also involved her lower legs to her knees. She is unaware of tingling during the day. Rest does not trigger symptoms and there is no benefit with movement. No identifiable triggers, alleviating, or exacerbating factors.  Balance is not as good as it used to be and she endorses stumbling about once per month. She fell a few weeks ago and tripped by vacuuming.   In October 2015, she had two spells where she was laying in bed and developed abrupt room-spinning sensation and severe nasuea. She had three spells since then all which are triggered by head movement to the left, each lasting about 1-3 minutes. No associated headaches.   UPDATE 07/26/2015:  She cancelled her EMG because of change in her schedule and forget to reschedule.   She continues to have bilateral tingling sensation from her mid-thigh to her foot which is unchanged.  She has fallen three times since her last visit, twice while vacuuming and once while getting something out of the door.  She did not sustain any injuries.  She denies low back pain.   Her dizziness is much better and self-resolved.    Father and brother have diabetic neuropathy.  Medications:  Current Outpatient Prescriptions on File Prior to Visit  Medication Sig Dispense Refill  . Cholecalciferol (VITAMIN D3) 2000 UNITS TABS Take by mouth.    Marland Kitchen FLUoxetine (PROZAC) 20 MG tablet Take 2  tablets (40 mg total) by mouth daily. 180 tablet 1  . Lactase (LACTOSE INTOLERANCE PO) Take by mouth daily.    . Multiple Vitamin (MULTIVITAMIN) tablet Take 1 tablet by mouth daily.    . Omega-3 Fatty Acids (FISH OIL) 1000 MG CAPS Take 1 capsule by mouth daily.    . Probiotic Product (PROBIOTIC DAILY PO) Take by mouth.     No current facility-administered medications on file prior to visit.    Allergies:  Allergies  Allergen Reactions  . Cymbalta [Duloxetine Hcl]     angioedema  . Penicillins     Review of Systems:  CONSTITUTIONAL: No fevers, chills, night sweats, or weight loss.  EYES: No visual changes or eye pain ENT: No hearing changes.  No history of nose bleeds.   RESPIRATORY: No cough, wheezing and shortness of breath.   CARDIOVASCULAR: Negative for chest pain, and palpitations.   GI: Negative for abdominal discomfort, blood in stools or black stools.  No recent change in bowel habits.   GU:  No history of incontinence.   MUSCLOSKELETAL: No history of joint pain or swelling.  No myalgias.   SKIN: Negative for lesions, rash, and itching.   ENDOCRINE: Negative for cold or heat intolerance, polydipsia or goiter.   PSYCH:  No depression or anxiety symptoms.   NEURO: As Above.   Vital Signs:  BP 104/68 mmHg  Pulse 99  Ht 5\' 7"  (1.702 m)  Wt 175 lb 3 oz (79.465 kg)  BMI 27.43 kg/m2  SpO2 98%  LMP 09/14/2011  Neurological Exam: MENTAL STATUS including orientation to time, place, person, recent and remote memory, attention span and concentration, language, and fund of knowledge is normal.  Speech is not dysarthric.  CRANIAL NERVES:   Pupils equal round and reactive to light.  Normal conjugate, extra-ocular eye movements in all directions of gaze.  No ptosis.  Face is symmetric.   MOTOR:  Motor strength is 5/5 in all extremities.  No atrophy, fasciculations or abnormal movements.  No pronator drift.  Tone is normal.    MSRs:  Reflexes are 2+/4 throughout  SENSORY:   Intact to vibration throughout.  COORDINATION/GAIT:  Normal finger-to- nose-finger and heel-to-shin.  Intact rapid alternating movements bilaterally.  Gait narrow based and stable. Stressed and tandem gait intact.  Data: Lab Results  Component Value Date   [id number] 01/09/2015   Lab Results  Component Value Date   TSH 1.19 01/09/2015   Lab Results  Component Value Date   [id number] 01/09/2015   Lab Results  Component Value Date   HGBA1C 5.8 12/12/2013     IMPRESSION/PLAN: [name] is a 60 year-old female returning for evaluation of bilateral lower leg paresthesias and spells of dizziness. Her leg symptoms are atypical for neuropathy, especially as it involves the entire lower legs in the setting of a normal neurological exam. Restless leg syndrome is a consideration, but there is no worsening at rest or improvement with activity. She will have EMG of the legs done to better characterize the nature of her symptoms. Medication for symptom control was declined.   Return to clinic 21-months   The duration of this appointment visit was 20 minutes of face-to-face time with the patient.  Greater than 50% of this time was spent in counseling, explanation of diagnosis, planning of further management, and coordination of care.   Thank you for allowing me to participate in patient's care.  If I can answer any additional questions, I would be pleased to do so.    Sincerely,    Grazia Taffe K. Posey Pronto, DO

## 2015-08-06 ENCOUNTER — Other Ambulatory Visit: Payer: Self-pay | Admitting: *Deleted

## 2015-08-06 ENCOUNTER — Ambulatory Visit (INDEPENDENT_AMBULATORY_CARE_PROVIDER_SITE_OTHER): Payer: Federal, State, Local not specified - PPO | Admitting: Neurology

## 2015-08-06 DIAGNOSIS — R202 Paresthesia of skin: Secondary | ICD-10-CM

## 2015-08-06 DIAGNOSIS — R42 Dizziness and giddiness: Secondary | ICD-10-CM

## 2015-08-06 DIAGNOSIS — H8112 Benign paroxysmal vertigo, left ear: Secondary | ICD-10-CM | POA: Diagnosis not present

## 2015-08-06 NOTE — Procedures (Signed)
[facility]  [address]  [place],  [address] Tel: [phone] Fax:  [phone] Test Date:  08/06/2015  Patient: Shelly Kim DOB: [birth year] Physician: Narda Amber  Sex: Female Height: 5\' 7"  Ref Phys: Narda Amber  ID#: [id number] Temp: 32.2C Technician: Jerilynn Mages. Dean   Patient Complaints: This is a 60 years old female referred for evaluation of bilateral feet and leg numbness and tingling.  NCV & EMG Findings: Extensive electrodiagnostic testing of the right lower extremity and additional studies of the left shows: 1. Bilateral sural and superficial peroneal sensory responses are within normal limits. 2. Right peroneal motor response at the extensor digitorum brevis is absent, however; peroneal motor response recording at the tibialis anterior is normal.  These findings are nonspecific and likely due to localize compression from shoe wearing. Left peroneal and bilateral tibial motor responses are within normal limits 3. There is no evidence of active or chronic motor axon loss changes affecting any of the tested muscles.  Impression: This is essentially a normal study of the lower extremities. There is no evidence of a generalized sensorimotor polyneuropathy or lumbosacral radiculopathy.   _____________________________ Narda Amber, D.O.    Nerve Conduction Studies Anti Sensory Summary Table   Stim Site NR Peak (ms) Norm Peak (ms) P-T Amp (V) Norm P-T Amp  Left Sup Peroneal Anti Sensory (Ant Lat Mall)  32.2C  12 cm    2.8 <4.6 6.1 >3  Right Sup Peroneal Anti Sensory (Ant Lat Mall)  32.2C  12 cm    2.2 <4.6 4.4 >3  Left Sural Anti Sensory (Lat Mall)  32.2C  Calf    3.5 <4.6 4.2 >3  Right Sural Anti Sensory (Lat Mall)  32.2C  Calf    3.3 <4.6 4.0 >3   Motor Summary Table   Stim Site NR Onset (ms) Norm Onset (ms) O-P Amp (mV) Norm O-P Amp Site1 Site2 Delta-0 (ms) Dist (cm) Vel (m/s) Norm Vel (m/s)  Left Peroneal Motor (Ext Dig Brev)   32.2C  Ankle    3.9 <6.0 3.7 >2.5 B Fib Ankle 6.4 32.0 50 >40  B Fib    10.3  3.5  Poplt B Fib 1.6 10.0 63 >40  Poplt    11.9  3.5         Right Peroneal Motor (Ext Dig Brev)  32.2C  Ankle NR  <6.0  >2.5 B Fib Ankle  0.0  >40  B Fib NR     Poplt B Fib  0.0  >40  Poplt NR            Left Peroneal TA Motor (Tib Ant)  32.2C  Fib Head    3.0 <4.5 6.8 >3 Poplit Fib Head 1.4 8.0 57 >40  Poplit    4.4  6.8         Right Peroneal TA Motor (Tib Ant)  32.2C  Fib Head    3.0 <4.5 8.0 >3 Poplit Fib Head 1.2 8.0 67 >40  Poplit    4.2  8.0         Left Tibial Motor (Abd Hall Brev)  32.2C  Ankle    3.5 <6.0 11.1 >4 Knee Ankle 8.7 38.0 44 >40  Knee    12.2  9.2         Right Tibial Motor (Abd Hall Brev)  32.2C  Ankle    3.6 <6.0 7.3 >4 Knee Ankle 8.7 38.0 44 >40  Knee    12.3  5.8  H Reflex Studies   NR H-Lat (ms) Lat Norm (ms) L-R H-Lat (ms)  Left Tibial (Gastroc)  32.2C     35.37 <35 0.41  Right Tibial (Gastroc)  32.2C     34.97 <35 0.41   EMG   Side Muscle Ins Act Fibs Psw Fasc Number Recrt Dur Dur. Amp Amp. Poly Poly. Comment  Right AntTibialis Nml Nml Nml Nml Nml Nml Nml Nml Nml Nml Nml Nml N/A  Right Gastroc Nml Nml Nml Nml Nml Nml Nml Nml Nml Nml Nml Nml N/A  Right Flex Dig Long Nml Nml Nml Nml Nml Nml Nml Nml Nml Nml Nml Nml N/A  Right RectFemoris Nml Nml Nml Nml Nml Nml Nml Nml Nml Nml Nml Nml N/A  Right BicepsFemS Nml Nml Nml Nml Nml Nml Nml Nml Nml Nml Nml Nml N/A  Right GluteusMed Nml Nml Nml Nml Nml Nml Nml Nml Nml Nml Nml Nml N/A  Left AntTibialis Nml Nml Nml Nml Nml Nml Nml Nml Nml Nml Nml Nml N/A  Left Gastroc Nml Nml Nml Nml Nml Nml Nml Nml Nml Nml Nml Nml N/A  Left Flex Dig Long Nml Nml Nml Nml Nml Nml Nml Nml Nml Nml Nml Nml N/A  Left RectFemoris Nml Nml Nml Nml Nml Nml Nml Nml Nml Nml Nml Nml N/A  Left GluteusMed Nml Nml Nml Nml Nml Nml Nml Nml Nml Nml Nml Nml N/A      Waveforms:

## 2015-08-14 ENCOUNTER — Ambulatory Visit
Admission: RE | Admit: 2015-08-14 | Discharge: 2015-08-14 | Disposition: A | Discharge status: Home / Self Care | Payer: Federal, State, Local not specified - PPO | Source: Ambulatory Visit | Attending: Neurology | Admitting: Neurology

## 2015-08-14 DIAGNOSIS — R202 Paresthesia of skin: Secondary | ICD-10-CM

## 2015-11-07 ENCOUNTER — Other Ambulatory Visit: Payer: Self-pay | Admitting: Internal Medicine

## 2015-11-14 ENCOUNTER — Encounter: Payer: Federal, State, Local not specified - PPO | Admitting: Internal Medicine

## 2016-01-06 ENCOUNTER — Encounter: Payer: Federal, State, Local not specified - PPO | Admitting: Internal Medicine

## 2016-03-19 ENCOUNTER — Telehealth: Payer: Self-pay | Admitting: Internal Medicine

## 2016-03-19 NOTE — Telephone Encounter (Signed)
Left pt a vm to call office to sch flu shot. °

## 2016-05-11 ENCOUNTER — Other Ambulatory Visit: Payer: Self-pay

## 2016-05-11 MED ORDER — FLUOXETINE HCL 20 MG PO CAPS: 40.0000 mg | ORAL_CAPSULE | Freq: Every day | ORAL | 0 refills | Status: DC

## 2016-05-11 NOTE — Telephone Encounter (Signed)
Last seen on 05/16/15 by Dr. Gilford Rile.  Last filled, 11/07/15 with a 90day with one refill.  Please advise, no follow up on file. thanks

## 2016-07-21 DIAGNOSIS — R42 Dizziness and giddiness: Secondary | ICD-10-CM | POA: Diagnosis not present

## 2016-07-24 DIAGNOSIS — H8112 Benign paroxysmal vertigo, left ear: Secondary | ICD-10-CM | POA: Diagnosis not present

## 2016-07-28 DIAGNOSIS — H8112 Benign paroxysmal vertigo, left ear: Secondary | ICD-10-CM | POA: Diagnosis not present

## 2016-07-30 DIAGNOSIS — Z01411 Encounter for gynecological examination (general) (routine) with abnormal findings: Secondary | ICD-10-CM | POA: Diagnosis not present

## 2016-07-30 DIAGNOSIS — R7309 Other abnormal glucose: Secondary | ICD-10-CM | POA: Diagnosis not present

## 2016-07-30 DIAGNOSIS — Z0000 Encounter for general adult medical examination without abnormal findings: Secondary | ICD-10-CM | POA: Diagnosis not present

## 2016-07-30 DIAGNOSIS — E663 Overweight: Secondary | ICD-10-CM | POA: Diagnosis not present

## 2016-07-30 DIAGNOSIS — E559 Vitamin D deficiency, unspecified: Secondary | ICD-10-CM | POA: Diagnosis not present

## 2016-07-30 DIAGNOSIS — E785 Hyperlipidemia, unspecified: Secondary | ICD-10-CM | POA: Diagnosis not present

## 2016-07-30 DIAGNOSIS — R42 Dizziness and giddiness: Secondary | ICD-10-CM | POA: Diagnosis not present

## 2016-07-31 ENCOUNTER — Other Ambulatory Visit: Payer: Self-pay | Admitting: Internal Medicine

## 2016-07-31 DIAGNOSIS — R42 Dizziness and giddiness: Secondary | ICD-10-CM

## 2016-08-03 ENCOUNTER — Inpatient Hospital Stay
Admission: RE | Admit: 2016-08-03 | Discharge: 2016-08-03 | Disposition: A | Discharge status: Home / Self Care | Payer: Federal, State, Local not specified - PPO | Source: Ambulatory Visit | Attending: Internal Medicine | Admitting: Internal Medicine

## 2016-08-03 ENCOUNTER — Ambulatory Visit
Admission: RE | Admit: 2016-08-03 | Discharge: 2016-08-03 | Disposition: A | Discharge status: Home / Self Care | Payer: Federal, State, Local not specified - PPO | Source: Ambulatory Visit | Attending: Internal Medicine | Admitting: Internal Medicine

## 2016-08-03 DIAGNOSIS — R42 Dizziness and giddiness: Secondary | ICD-10-CM | POA: Diagnosis not present

## 2016-08-03 MED ORDER — IOPAMIDOL (ISOVUE-300) INJECTION 61%
75.0000 mL | Freq: Once | INTRAVENOUS | Status: AC | PRN
  Administered 2016-08-03: 75 mL via INTRAVENOUS

## 2016-08-04 DIAGNOSIS — H8112 Benign paroxysmal vertigo, left ear: Secondary | ICD-10-CM | POA: Diagnosis not present

## 2016-08-05 DIAGNOSIS — E559 Vitamin D deficiency, unspecified: Secondary | ICD-10-CM | POA: Diagnosis not present

## 2016-08-05 DIAGNOSIS — E785 Hyperlipidemia, unspecified: Secondary | ICD-10-CM | POA: Diagnosis not present

## 2016-08-05 DIAGNOSIS — Z0000 Encounter for general adult medical examination without abnormal findings: Secondary | ICD-10-CM | POA: Diagnosis not present

## 2016-08-05 DIAGNOSIS — R7309 Other abnormal glucose: Secondary | ICD-10-CM | POA: Diagnosis not present

## 2016-08-10 ENCOUNTER — Other Ambulatory Visit: Payer: Self-pay | Admitting: Family Medicine

## 2016-08-10 DIAGNOSIS — H8112 Benign paroxysmal vertigo, left ear: Secondary | ICD-10-CM | POA: Diagnosis not present

## 2016-08-10 NOTE — Telephone Encounter (Signed)
Refilled 05/11/16. Pt last seen by Dr.Walker on 05/16/15. Please advise NO future appt.

## 2016-08-28 DIAGNOSIS — Z85828 Personal history of other malignant neoplasm of skin: Secondary | ICD-10-CM | POA: Diagnosis not present

## 2016-10-02 DIAGNOSIS — H8112 Benign paroxysmal vertigo, left ear: Secondary | ICD-10-CM | POA: Diagnosis not present

## 2016-10-03 ENCOUNTER — Other Ambulatory Visit: Payer: Self-pay | Admitting: Family Medicine

## 2016-11-11 DIAGNOSIS — E785 Hyperlipidemia, unspecified: Secondary | ICD-10-CM | POA: Diagnosis not present

## 2016-12-21 DIAGNOSIS — E785 Hyperlipidemia, unspecified: Secondary | ICD-10-CM | POA: Diagnosis not present

## 2016-12-21 DIAGNOSIS — E663 Overweight: Secondary | ICD-10-CM | POA: Diagnosis not present

## 2017-02-15 DIAGNOSIS — H524 Presbyopia: Secondary | ICD-10-CM | POA: Diagnosis not present

## 2017-02-16 DIAGNOSIS — M6289 Other specified disorders of muscle: Secondary | ICD-10-CM | POA: Diagnosis not present

## 2017-02-16 DIAGNOSIS — E785 Hyperlipidemia, unspecified: Secondary | ICD-10-CM | POA: Diagnosis not present

## 2017-02-16 DIAGNOSIS — R42 Dizziness and giddiness: Secondary | ICD-10-CM | POA: Diagnosis not present

## 2017-03-22 DIAGNOSIS — E063 Autoimmune thyroiditis: Secondary | ICD-10-CM | POA: Diagnosis not present

## 2017-03-22 DIAGNOSIS — E663 Overweight: Secondary | ICD-10-CM | POA: Diagnosis not present

## 2017-04-07 ENCOUNTER — Encounter: Payer: Self-pay | Admitting: Neurology

## 2017-04-08 ENCOUNTER — Telehealth: Payer: Self-pay

## 2017-04-08 ENCOUNTER — Ambulatory Visit: Payer: Federal, State, Local not specified - PPO | Admitting: Neurology

## 2017-04-08 NOTE — Telephone Encounter (Signed)
I called and spoke with patient about rescheduling her appt due to Dr. Brett Fairy needing to leave the office suddenly. Patient was ok with this so we rescheduled to 05/12/17 and I have put her on a cancellation list.

## 2017-04-14 ENCOUNTER — Ambulatory Visit: Payer: Federal, State, Local not specified - PPO | Admitting: Neurology

## 2017-05-12 ENCOUNTER — Encounter: Payer: Self-pay | Admitting: Neurology

## 2017-05-12 ENCOUNTER — Ambulatory Visit: Payer: Federal, State, Local not specified - PPO | Admitting: Neurology

## 2017-05-12 VITALS — BP 104/76 | HR 83 | Ht 67.0 in | Wt 181.0 lb

## 2017-05-12 DIAGNOSIS — M542 Cervicalgia: Secondary | ICD-10-CM | POA: Diagnosis not present

## 2017-05-12 DIAGNOSIS — R531 Weakness: Secondary | ICD-10-CM | POA: Diagnosis not present

## 2017-05-12 DIAGNOSIS — M6281 Muscle weakness (generalized): Secondary | ICD-10-CM | POA: Diagnosis not present

## 2017-05-12 DIAGNOSIS — E063 Autoimmune thyroiditis: Secondary | ICD-10-CM

## 2017-05-12 DIAGNOSIS — E663 Overweight: Secondary | ICD-10-CM | POA: Diagnosis not present
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
## 2017-05-12 NOTE — Patient Instructions (Signed)
Magnetic Resonance Imaging  Magnetic resonance imaging (MRI) is a painless test that takes pictures of the inside of your body. This test uses a strong magnet. This test does not use X-rays or radiation.  What happens before the procedure?   You will be asked to take off all metal. This includes:  ? Your watch, jewelry, and other metal items.  ? Some makeup may have very small bits of metal and may need to be taken off.  ? Braces and fillings normally are not a problem.  What happens during the procedure?   You may be given earplugs or headphones to listen to music. The machine can be noisy.   You might get a shot (injection) with a dye (contrast material) to help the MRI take better pictures.   MRI is done in a tunnel-shaped scanner. You will lie on a table that slides into the tunnel-shaped scanner. Once inside, you will still be able to talk to the person doing the test.   You will be asked to hold very still. You will be told when you can shift position. You may have to wait a few minutes to make sure the images are readable.  What happens after the procedure?   You may go back to your normal activities right away.   If you got a shot of dye, it will pass naturally through your body within a day.   Your doctor will talk to you about the results.  This information is not intended to replace advice given to you by your health care provider. Make sure you discuss any questions you have with your health care provider.  Document Released: 07/18/2010 Document Revised: 11/21/2015 Document Reviewed: 08/10/2013  Elsevier Interactive Patient Education  2018 Elsevier Inc.

## 2017-05-18 ENCOUNTER — Encounter: Payer: Federal, State, Local not specified - PPO | Admitting: Neurology

## 2017-05-19 ENCOUNTER — Ambulatory Visit: Payer: Federal, State, Local not specified - PPO

## 2017-05-19 DIAGNOSIS — R531 Weakness: Secondary | ICD-10-CM

## 2017-05-19 DIAGNOSIS — M6281 Muscle weakness (generalized): Secondary | ICD-10-CM

## 2017-05-19 DIAGNOSIS — E063 Autoimmune thyroiditis: Secondary | ICD-10-CM

## 2017-05-19 DIAGNOSIS — M542 Cervicalgia: Secondary | ICD-10-CM | POA: Diagnosis not present

## 2017-05-19 MED ORDER — GADOPENTETATE DIMEGLUMINE 469.01 MG/ML IV SOLN: 15.0000 mL | Freq: Once | INTRAVENOUS | Status: AC | PRN

## 2017-05-24 ENCOUNTER — Telehealth: Payer: Self-pay | Admitting: Neurology

## 2017-05-24 NOTE — Telephone Encounter (Signed)
Went over MRI results with the patient. Pt verbalized understanding.Pt had no questions at this time but was encouraged to call back if questions arise.

## 2017-05-24 NOTE — Telephone Encounter (Signed)
-----   Message from Larey Seat, MD sent at 05/24/2017  8:05 AM EST ----- Dear Shelly Kim. Schweers: no spinal stenosis, but DDD. No impingement. Normal cervical spine appearance for age. CD  Cc Dr Karlton Lemon

## 2017-05-26 ENCOUNTER — Ambulatory Visit (INDEPENDENT_AMBULATORY_CARE_PROVIDER_SITE_OTHER): Payer: Federal, State, Local not specified - PPO | Admitting: Neurology

## 2017-05-26 ENCOUNTER — Ambulatory Visit: Payer: Federal, State, Local not specified - PPO | Admitting: Neurology

## 2017-05-26 ENCOUNTER — Encounter: Payer: Self-pay | Admitting: Neurology

## 2017-05-26 DIAGNOSIS — M6281 Muscle weakness (generalized): Secondary | ICD-10-CM

## 2017-05-26 DIAGNOSIS — M542 Cervicalgia: Secondary | ICD-10-CM

## 2017-05-26 DIAGNOSIS — E063 Autoimmune thyroiditis: Secondary | ICD-10-CM

## 2017-05-26 DIAGNOSIS — R5382 Chronic fatigue, unspecified: Secondary | ICD-10-CM

## 2017-05-26 DIAGNOSIS — R531 Weakness: Secondary | ICD-10-CM

## 2017-05-26 NOTE — Progress Notes (Signed)
Please refer to EMG and nerve conduction study procedure note. 

## 2017-05-26 NOTE — Procedures (Signed)
     HISTORY:  Shelly Kim is a 61 year old patient with a history of episodic bilateral upper extremity weakness that would occur off and on during the summer 2018.  The patient is no longer having these events, she is being evaluated for the episodes of weakness.  She denies any discomfort in the neck or shoulders.  NERVE CONDUCTION STUDIES:  Nerve conduction studies were performed on both upper extremities. The distal motor latencies and motor amplitudes for the median and ulnar nerves were within normal limits. The F wave latencies and nerve conduction velocities for these nerves were also normal. The sensory latencies for the median and ulnar nerves were normal.   EMG STUDIES:  EMG study was performed on the right upper extremity:  The first dorsal interosseous muscle reveals 2 to 4 K units with full recruitment. No fibrillations or positive waves were noted. The abductor pollicis brevis muscle reveals 2 to 4 K units with full recruitment. No fibrillations or positive waves were noted. The extensor indicis proprius muscle reveals 1 to 3 K units with full recruitment. No fibrillations or positive waves were noted. The pronator teres muscle reveals 2 to 3 K units with full recruitment. No fibrillations or positive waves were noted. The biceps muscle reveals 1 to 2 K units with full recruitment. No fibrillations or positive waves were noted. The triceps muscle reveals 2 to 4 K units with full recruitment. No fibrillations or positive waves were noted. The anterior deltoid muscle reveals 2 to 3 K units with full recruitment. No fibrillations or positive waves were noted. The cervical paraspinal muscles were tested at 2 levels. No abnormalities of insertional activity were seen at either level tested. There was poor relaxation.   IMPRESSION:  Nerve conduction studies done on both upper extremities were within normal limits.  No evidence of a neuropathy is seen.  EMG of the right upper  extremity was unremarkable without evidence of a radiculopathy or a myopathic disorder.  Jill Alexanders MD 05/26/2017 4:19 PM  [facility] [address] [place], [place] [address]  Phone [phone] Fax [phone]

## 2017-05-26 NOTE — Progress Notes (Signed)
    [place]    Nerve / Sites Muscle Latency Ref. Amplitude Ref. Rel Amp Segments Distance Velocity Ref. Area    ms ms mV mV %  cm m/s m/s mVms  L Median - APB     Wrist APB 2.8 ?4.4 11.2 ?4.0 100 Wrist - APB 7   51.9     Upper arm APB 6.7  11.5  103 Upper arm - Wrist 25 65 ?49 48.0  R Median - APB     Wrist APB 3.0 ?4.4 11.2 ?4.0 100 Wrist - APB 7   48.9     Upper arm APB 6.7  11.0  98.1 Upper arm - Wrist 24 66 ?49 48.5  L Ulnar - ADM     Wrist ADM 2.2 ?3.3 9.7 ?6.0 100 Wrist - ADM 7   34.4     B.Elbow ADM 5.8  7.0  72.8 B.Elbow - Wrist 22 62 ?49 25.7     A.Elbow ADM 7.3  7.8  110 A.Elbow - B.Elbow 10 64 ?49 29.1         A.Elbow - Wrist      R Ulnar - ADM     Wrist ADM 2.5 ?3.3 9.2 ?6.0 100 Wrist - ADM 7   31.1     B.Elbow ADM 6.5  8.1  87.9 B.Elbow - Wrist 22 55 ?49 26.9     A.Elbow ADM 8.0  7.4  91.7 A.Elbow - B.Elbow 10 66 ?49 24.4         A.Elbow - Wrist                 SNC    Nerve / Sites Rec. Site Peak Lat Amp Segments Distance    ms V  cm  L Median - Orthodromic (Dig II, Mid palm)     Dig II Wrist 2.6 91 Dig II - Wrist 13  R Median - Orthodromic (Dig II, Mid palm)     Dig II Wrist 2.8 73 Dig II - Wrist 13  L Ulnar - Orthodromic, (Dig V, Mid palm)     Dig V Wrist 2.4 99 Dig V - Wrist 11  R Ulnar - Orthodromic, (Dig V, Mid palm)     Dig V Wrist 2.6 73 Dig V - Wrist 11             F  Wave    Nerve F Lat Ref.   ms ms  L Median - APB 25.5 ?31.0  L Ulnar - ADM 25.2 ?32.0  R Median - APB 26.1 ?31.0  R Ulnar - ADM 25.8 ?32.0             EMG

## 2017-05-31 ENCOUNTER — Telehealth: Payer: Self-pay

## 2017-05-31 NOTE — Telephone Encounter (Signed)
-----   Message from Darleen Crocker, RN sent at 05/31/2017 10:53 AM EST -----  Please call ----- Message ----- From: Larey Seat, MD Sent: 05/26/2017   4:40 PM To: Darleen Crocker, RN  No sign of nerve impingement , neuropathy .  Cc Heath Gold, MD

## 2017-05-31 NOTE — Telephone Encounter (Signed)
I spoke with patient and made her aware of results. She did not have any questions.

## 2017-08-16 ENCOUNTER — Ambulatory Visit: Payer: Federal, State, Local not specified - PPO | Admitting: Neurology

## 2017-11-03 ENCOUNTER — Telehealth: Payer: Self-pay | Admitting: *Deleted

## 2017-11-03 NOTE — Telephone Encounter (Signed)
Per Angie, pt requesting letter to state why EMG/NCS was medically necessary. Test was completed on 05/26/17.

## 2017-11-03 NOTE — Telephone Encounter (Signed)
The study was ordered by Dr. Brett Fairy, she will need to be the one to write the letter.

## 2017-11-04 ENCOUNTER — Encounter: Payer: Self-pay | Admitting: Neurology

## 2017-11-04 NOTE — Telephone Encounter (Signed)
Letter written

## 2017-11-09 ENCOUNTER — Ambulatory Visit: Payer: Federal, State, Local not specified - PPO | Admitting: Internal Medicine

## 2017-12-09 DIAGNOSIS — F331 Major depressive disorder, recurrent, moderate: Secondary | ICD-10-CM | POA: Diagnosis not present

## 2017-12-20 ENCOUNTER — Ambulatory Visit: Payer: Federal, State, Local not specified - PPO | Admitting: Internal Medicine

## 2018-01-11 DIAGNOSIS — F331 Major depressive disorder, recurrent, moderate: Secondary | ICD-10-CM | POA: Diagnosis not present

## 2018-03-03 DIAGNOSIS — D2262 Melanocytic nevi of left upper limb, including shoulder: Secondary | ICD-10-CM | POA: Diagnosis not present

## 2018-03-03 DIAGNOSIS — D225 Melanocytic nevi of trunk: Secondary | ICD-10-CM | POA: Diagnosis not present

## 2018-03-03 DIAGNOSIS — D2272 Melanocytic nevi of left lower limb, including hip: Secondary | ICD-10-CM | POA: Diagnosis not present

## 2018-03-03 DIAGNOSIS — Z85828 Personal history of other malignant neoplasm of skin: Secondary | ICD-10-CM | POA: Diagnosis not present

## 2018-05-04 DIAGNOSIS — K5901 Slow transit constipation: Secondary | ICD-10-CM | POA: Diagnosis not present

## 2018-05-04 DIAGNOSIS — Z23 Encounter for immunization: Secondary | ICD-10-CM | POA: Diagnosis not present

## 2018-05-04 DIAGNOSIS — E039 Hypothyroidism, unspecified: Secondary | ICD-10-CM | POA: Diagnosis not present

## 2018-05-04 DIAGNOSIS — R202 Paresthesia of skin: Secondary | ICD-10-CM | POA: Diagnosis not present

## 2018-05-04 DIAGNOSIS — H8110 Benign paroxysmal vertigo, unspecified ear: Secondary | ICD-10-CM | POA: Diagnosis not present

## 2018-08-03 ENCOUNTER — Other Ambulatory Visit: Payer: Self-pay | Admitting: Internal Medicine

## 2018-08-03 DIAGNOSIS — Z1231 Encounter for screening mammogram for malignant neoplasm of breast: Secondary | ICD-10-CM

## 2018-08-03 DIAGNOSIS — Z1239 Encounter for other screening for malignant neoplasm of breast: Secondary | ICD-10-CM | POA: Diagnosis not present

## 2018-08-03 DIAGNOSIS — H8110 Benign paroxysmal vertigo, unspecified ear: Secondary | ICD-10-CM | POA: Diagnosis not present

## 2018-08-03 DIAGNOSIS — Z23 Encounter for immunization: Secondary | ICD-10-CM | POA: Diagnosis not present

## 2018-08-03 DIAGNOSIS — E039 Hypothyroidism, unspecified: Secondary | ICD-10-CM | POA: Diagnosis not present

## 2018-08-03 DIAGNOSIS — Z0000 Encounter for general adult medical examination without abnormal findings: Secondary | ICD-10-CM | POA: Diagnosis not present

## 2018-08-03 DIAGNOSIS — Z780 Asymptomatic menopausal state: Secondary | ICD-10-CM

## 2018-09-30 ENCOUNTER — Other Ambulatory Visit: Payer: Federal, State, Local not specified - PPO

## 2018-09-30 ENCOUNTER — Ambulatory Visit: Payer: Federal, State, Local not specified - PPO

## 2018-12-05 ENCOUNTER — Other Ambulatory Visit: Payer: Self-pay

## 2018-12-05 ENCOUNTER — Ambulatory Visit
Admission: RE | Admit: 2018-12-05 | Discharge: 2018-12-05 | Disposition: A | Discharge status: Home / Self Care | Payer: Federal, State, Local not specified - PPO | Source: Ambulatory Visit | Attending: Internal Medicine | Admitting: Internal Medicine

## 2018-12-05 DIAGNOSIS — Z780 Asymptomatic menopausal state: Secondary | ICD-10-CM | POA: Diagnosis not present

## 2018-12-05 DIAGNOSIS — Z1231 Encounter for screening mammogram for malignant neoplasm of breast: Secondary | ICD-10-CM

## 2018-12-05 DIAGNOSIS — M8589 Other specified disorders of bone density and structure, multiple sites: Secondary | ICD-10-CM | POA: Diagnosis not present

## 2019-02-03 ENCOUNTER — Other Ambulatory Visit: Payer: Self-pay | Admitting: Internal Medicine

## 2019-02-03 DIAGNOSIS — E042 Nontoxic multinodular goiter: Secondary | ICD-10-CM | POA: Diagnosis not present

## 2019-02-03 DIAGNOSIS — R197 Diarrhea, unspecified: Secondary | ICD-10-CM | POA: Diagnosis not present

## 2019-02-03 DIAGNOSIS — E063 Autoimmune thyroiditis: Secondary | ICD-10-CM | POA: Diagnosis not present

## 2019-02-10 ENCOUNTER — Ambulatory Visit
Admission: RE | Admit: 2019-02-10 | Discharge: 2019-02-10 | Disposition: A | Discharge status: Home / Self Care | Payer: Federal, State, Local not specified - PPO | Source: Ambulatory Visit | Attending: Internal Medicine | Admitting: Internal Medicine

## 2019-02-10 DIAGNOSIS — E042 Nontoxic multinodular goiter: Secondary | ICD-10-CM

## 2019-02-10 DIAGNOSIS — E041 Nontoxic single thyroid nodule: Secondary | ICD-10-CM | POA: Diagnosis not present

## 2020-06-07 DIAGNOSIS — D2261 Melanocytic nevi of right upper limb, including shoulder: Secondary | ICD-10-CM | POA: Diagnosis not present

## 2020-06-07 DIAGNOSIS — D225 Melanocytic nevi of trunk: Secondary | ICD-10-CM | POA: Diagnosis not present

## 2020-06-07 DIAGNOSIS — D2262 Melanocytic nevi of left upper limb, including shoulder: Secondary | ICD-10-CM | POA: Diagnosis not present

## 2020-06-07 DIAGNOSIS — Z85828 Personal history of other malignant neoplasm of skin: Secondary | ICD-10-CM | POA: Diagnosis not present

## 2020-12-31 ENCOUNTER — Other Ambulatory Visit: Payer: Self-pay | Admitting: Internal Medicine

## 2020-12-31 DIAGNOSIS — Z1231 Encounter for screening mammogram for malignant neoplasm of breast: Secondary | ICD-10-CM

## 2021-01-14 DIAGNOSIS — Z13220 Encounter for screening for lipoid disorders: Secondary | ICD-10-CM | POA: Diagnosis not present

## 2021-01-14 DIAGNOSIS — Z0000 Encounter for general adult medical examination without abnormal findings: Secondary | ICD-10-CM | POA: Diagnosis not present

## 2021-01-22 DIAGNOSIS — E042 Nontoxic multinodular goiter: Secondary | ICD-10-CM | POA: Diagnosis not present

## 2021-01-22 DIAGNOSIS — E785 Hyperlipidemia, unspecified: Secondary | ICD-10-CM | POA: Diagnosis not present

## 2021-01-24 ENCOUNTER — Ambulatory Visit
Admission: RE | Admit: 2021-01-24 | Discharge: 2021-01-24 | Disposition: A | Discharge status: Home / Self Care | Payer: Federal, State, Local not specified - PPO | Source: Ambulatory Visit

## 2021-01-24 ENCOUNTER — Other Ambulatory Visit: Payer: Self-pay

## 2021-01-24 DIAGNOSIS — Z1231 Encounter for screening mammogram for malignant neoplasm of breast: Secondary | ICD-10-CM | POA: Diagnosis not present

## 2021-02-13 ENCOUNTER — Other Ambulatory Visit: Payer: Self-pay | Admitting: Internal Medicine

## 2021-02-13 DIAGNOSIS — E2839 Other primary ovarian failure: Secondary | ICD-10-CM

## 2021-02-21 DIAGNOSIS — E785 Hyperlipidemia, unspecified: Secondary | ICD-10-CM | POA: Diagnosis not present

## 2021-06-11 DIAGNOSIS — S90121A Contusion of right lesser toe(s) without damage to nail, initial encounter: Secondary | ICD-10-CM | POA: Diagnosis not present

## 2021-06-11 DIAGNOSIS — Z85828 Personal history of other malignant neoplasm of skin: Secondary | ICD-10-CM | POA: Diagnosis not present

## 2021-06-11 DIAGNOSIS — L821 Other seborrheic keratosis: Secondary | ICD-10-CM | POA: Diagnosis not present

## 2021-06-11 DIAGNOSIS — D1801 Hemangioma of skin and subcutaneous tissue: Secondary | ICD-10-CM | POA: Diagnosis not present

## 2021-06-11 DIAGNOSIS — Z08 Encounter for follow-up examination after completed treatment for malignant neoplasm: Secondary | ICD-10-CM | POA: Diagnosis not present

## 2021-06-11 DIAGNOSIS — D485 Neoplasm of uncertain behavior of skin: Secondary | ICD-10-CM | POA: Diagnosis not present

## 2021-07-12 DIAGNOSIS — Z6827 Body mass index (BMI) 27.0-27.9, adult: Secondary | ICD-10-CM | POA: Diagnosis not present

## 2021-07-12 DIAGNOSIS — B029 Zoster without complications: Secondary | ICD-10-CM | POA: Diagnosis not present

## 2021-07-24 DIAGNOSIS — E042 Nontoxic multinodular goiter: Secondary | ICD-10-CM | POA: Diagnosis not present

## 2021-07-24 DIAGNOSIS — Z1211 Encounter for screening for malignant neoplasm of colon: Secondary | ICD-10-CM | POA: Diagnosis not present

## 2021-07-24 DIAGNOSIS — E2839 Other primary ovarian failure: Secondary | ICD-10-CM | POA: Diagnosis not present

## 2021-07-24 DIAGNOSIS — E785 Hyperlipidemia, unspecified: Secondary | ICD-10-CM | POA: Diagnosis not present

## 2021-07-24 DIAGNOSIS — Z8619 Personal history of other infectious and parasitic diseases: Secondary | ICD-10-CM | POA: Diagnosis not present

## 2021-07-24 DIAGNOSIS — E559 Vitamin D deficiency, unspecified: Secondary | ICD-10-CM | POA: Diagnosis not present

## 2021-08-12 ENCOUNTER — Other Ambulatory Visit: Payer: Federal, State, Local not specified - PPO

## 2021-10-23 DIAGNOSIS — R202 Paresthesia of skin: Secondary | ICD-10-CM | POA: Diagnosis not present

## 2021-10-23 DIAGNOSIS — E559 Vitamin D deficiency, unspecified: Secondary | ICD-10-CM | POA: Diagnosis not present

## 2021-11-18 ENCOUNTER — Ambulatory Visit
Admission: RE | Admit: 2021-11-18 | Discharge: 2021-11-18 | Disposition: A | Discharge status: Home / Self Care | Payer: Federal, State, Local not specified - PPO | Source: Ambulatory Visit | Attending: Internal Medicine | Admitting: Internal Medicine

## 2021-11-18 DIAGNOSIS — M8588 Other specified disorders of bone density and structure, other site: Secondary | ICD-10-CM | POA: Diagnosis not present

## 2021-11-18 DIAGNOSIS — Z780 Asymptomatic menopausal state: Secondary | ICD-10-CM | POA: Diagnosis not present

## 2021-11-18 DIAGNOSIS — E2839 Other primary ovarian failure: Secondary | ICD-10-CM

## 2021-11-18 DIAGNOSIS — M810 Age-related osteoporosis without current pathological fracture: Secondary | ICD-10-CM | POA: Diagnosis not present

## 2021-11-21 DIAGNOSIS — R42 Dizziness and giddiness: Secondary | ICD-10-CM | POA: Diagnosis not present

## 2021-12-01 DIAGNOSIS — M9900 Segmental and somatic dysfunction of head region: Secondary | ICD-10-CM | POA: Diagnosis not present

## 2021-12-01 DIAGNOSIS — R2681 Unsteadiness on feet: Secondary | ICD-10-CM | POA: Diagnosis not present

## 2021-12-01 DIAGNOSIS — R42 Dizziness and giddiness: Secondary | ICD-10-CM | POA: Diagnosis not present

## 2021-12-01 DIAGNOSIS — M9901 Segmental and somatic dysfunction of cervical region: Secondary | ICD-10-CM | POA: Diagnosis not present

## 2021-12-02 DIAGNOSIS — M9900 Segmental and somatic dysfunction of head region: Secondary | ICD-10-CM | POA: Diagnosis not present

## 2021-12-02 DIAGNOSIS — K644 Residual hemorrhoidal skin tags: Secondary | ICD-10-CM | POA: Diagnosis not present

## 2021-12-02 DIAGNOSIS — K582 Mixed irritable bowel syndrome: Secondary | ICD-10-CM | POA: Diagnosis not present

## 2021-12-02 DIAGNOSIS — R2681 Unsteadiness on feet: Secondary | ICD-10-CM | POA: Diagnosis not present

## 2021-12-02 DIAGNOSIS — Z1211 Encounter for screening for malignant neoplasm of colon: Secondary | ICD-10-CM | POA: Diagnosis not present

## 2021-12-02 DIAGNOSIS — R42 Dizziness and giddiness: Secondary | ICD-10-CM | POA: Diagnosis not present

## 2021-12-02 DIAGNOSIS — M9901 Segmental and somatic dysfunction of cervical region: Secondary | ICD-10-CM | POA: Diagnosis not present

## 2021-12-03 DIAGNOSIS — M9901 Segmental and somatic dysfunction of cervical region: Secondary | ICD-10-CM | POA: Diagnosis not present

## 2021-12-03 DIAGNOSIS — R2681 Unsteadiness on feet: Secondary | ICD-10-CM | POA: Diagnosis not present

## 2021-12-03 DIAGNOSIS — M9900 Segmental and somatic dysfunction of head region: Secondary | ICD-10-CM | POA: Diagnosis not present

## 2021-12-03 DIAGNOSIS — R42 Dizziness and giddiness: Secondary | ICD-10-CM | POA: Diagnosis not present

## 2021-12-04 DIAGNOSIS — R42 Dizziness and giddiness: Secondary | ICD-10-CM | POA: Diagnosis not present

## 2021-12-05 DIAGNOSIS — R2681 Unsteadiness on feet: Secondary | ICD-10-CM | POA: Diagnosis not present

## 2021-12-05 DIAGNOSIS — R42 Dizziness and giddiness: Secondary | ICD-10-CM | POA: Diagnosis not present

## 2021-12-05 DIAGNOSIS — M9901 Segmental and somatic dysfunction of cervical region: Secondary | ICD-10-CM | POA: Diagnosis not present

## 2021-12-05 DIAGNOSIS — M9900 Segmental and somatic dysfunction of head region: Secondary | ICD-10-CM | POA: Diagnosis not present

## 2021-12-08 DIAGNOSIS — M9901 Segmental and somatic dysfunction of cervical region: Secondary | ICD-10-CM | POA: Diagnosis not present

## 2021-12-08 DIAGNOSIS — R42 Dizziness and giddiness: Secondary | ICD-10-CM | POA: Diagnosis not present

## 2021-12-08 DIAGNOSIS — R2681 Unsteadiness on feet: Secondary | ICD-10-CM | POA: Diagnosis not present

## 2021-12-08 DIAGNOSIS — M9900 Segmental and somatic dysfunction of head region: Secondary | ICD-10-CM | POA: Diagnosis not present

## 2021-12-09 DIAGNOSIS — R42 Dizziness and giddiness: Secondary | ICD-10-CM | POA: Diagnosis not present

## 2021-12-09 DIAGNOSIS — H8192 Unspecified disorder of vestibular function, left ear: Secondary | ICD-10-CM | POA: Diagnosis not present

## 2021-12-10 DIAGNOSIS — M9901 Segmental and somatic dysfunction of cervical region: Secondary | ICD-10-CM | POA: Diagnosis not present

## 2021-12-10 DIAGNOSIS — R2681 Unsteadiness on feet: Secondary | ICD-10-CM | POA: Diagnosis not present

## 2021-12-10 DIAGNOSIS — R42 Dizziness and giddiness: Secondary | ICD-10-CM | POA: Diagnosis not present

## 2021-12-10 DIAGNOSIS — M9900 Segmental and somatic dysfunction of head region: Secondary | ICD-10-CM | POA: Diagnosis not present

## 2021-12-11 DIAGNOSIS — M9901 Segmental and somatic dysfunction of cervical region: Secondary | ICD-10-CM | POA: Diagnosis not present

## 2021-12-11 DIAGNOSIS — R2681 Unsteadiness on feet: Secondary | ICD-10-CM | POA: Diagnosis not present

## 2021-12-11 DIAGNOSIS — M9900 Segmental and somatic dysfunction of head region: Secondary | ICD-10-CM | POA: Diagnosis not present

## 2021-12-11 DIAGNOSIS — R42 Dizziness and giddiness: Secondary | ICD-10-CM | POA: Diagnosis not present

## 2021-12-15 DIAGNOSIS — R42 Dizziness and giddiness: Secondary | ICD-10-CM | POA: Diagnosis not present

## 2021-12-15 DIAGNOSIS — R262 Difficulty in walking, not elsewhere classified: Secondary | ICD-10-CM | POA: Diagnosis not present

## 2021-12-15 DIAGNOSIS — R278 Other lack of coordination: Secondary | ICD-10-CM | POA: Diagnosis not present

## 2021-12-16 DIAGNOSIS — M9901 Segmental and somatic dysfunction of cervical region: Secondary | ICD-10-CM | POA: Diagnosis not present

## 2021-12-16 DIAGNOSIS — R42 Dizziness and giddiness: Secondary | ICD-10-CM | POA: Diagnosis not present

## 2021-12-16 DIAGNOSIS — R2681 Unsteadiness on feet: Secondary | ICD-10-CM | POA: Diagnosis not present

## 2021-12-16 DIAGNOSIS — M9900 Segmental and somatic dysfunction of head region: Secondary | ICD-10-CM | POA: Diagnosis not present

## 2021-12-18 DIAGNOSIS — M9900 Segmental and somatic dysfunction of head region: Secondary | ICD-10-CM | POA: Diagnosis not present

## 2021-12-18 DIAGNOSIS — R2681 Unsteadiness on feet: Secondary | ICD-10-CM | POA: Diagnosis not present

## 2021-12-18 DIAGNOSIS — R42 Dizziness and giddiness: Secondary | ICD-10-CM | POA: Diagnosis not present

## 2021-12-18 DIAGNOSIS — M9901 Segmental and somatic dysfunction of cervical region: Secondary | ICD-10-CM | POA: Diagnosis not present

## 2021-12-31 DIAGNOSIS — R262 Difficulty in walking, not elsewhere classified: Secondary | ICD-10-CM | POA: Diagnosis not present

## 2021-12-31 DIAGNOSIS — R278 Other lack of coordination: Secondary | ICD-10-CM | POA: Diagnosis not present

## 2021-12-31 DIAGNOSIS — M9901 Segmental and somatic dysfunction of cervical region: Secondary | ICD-10-CM | POA: Diagnosis not present

## 2021-12-31 DIAGNOSIS — R42 Dizziness and giddiness: Secondary | ICD-10-CM | POA: Diagnosis not present

## 2021-12-31 DIAGNOSIS — M9900 Segmental and somatic dysfunction of head region: Secondary | ICD-10-CM | POA: Diagnosis not present

## 2021-12-31 DIAGNOSIS — R2681 Unsteadiness on feet: Secondary | ICD-10-CM | POA: Diagnosis not present

## 2022-01-02 DIAGNOSIS — M9900 Segmental and somatic dysfunction of head region: Secondary | ICD-10-CM | POA: Diagnosis not present

## 2022-01-02 DIAGNOSIS — M9901 Segmental and somatic dysfunction of cervical region: Secondary | ICD-10-CM | POA: Diagnosis not present

## 2022-01-02 DIAGNOSIS — R2681 Unsteadiness on feet: Secondary | ICD-10-CM | POA: Diagnosis not present

## 2022-01-02 DIAGNOSIS — R42 Dizziness and giddiness: Secondary | ICD-10-CM | POA: Diagnosis not present

## 2022-01-05 DIAGNOSIS — M9900 Segmental and somatic dysfunction of head region: Secondary | ICD-10-CM | POA: Diagnosis not present

## 2022-01-05 DIAGNOSIS — R262 Difficulty in walking, not elsewhere classified: Secondary | ICD-10-CM | POA: Diagnosis not present

## 2022-01-05 DIAGNOSIS — R42 Dizziness and giddiness: Secondary | ICD-10-CM | POA: Diagnosis not present

## 2022-01-05 DIAGNOSIS — M9901 Segmental and somatic dysfunction of cervical region: Secondary | ICD-10-CM | POA: Diagnosis not present

## 2022-01-05 DIAGNOSIS — R278 Other lack of coordination: Secondary | ICD-10-CM | POA: Diagnosis not present

## 2022-01-05 DIAGNOSIS — R2681 Unsteadiness on feet: Secondary | ICD-10-CM | POA: Diagnosis not present

## 2022-01-06 DIAGNOSIS — M9901 Segmental and somatic dysfunction of cervical region: Secondary | ICD-10-CM | POA: Diagnosis not present

## 2022-01-06 DIAGNOSIS — R42 Dizziness and giddiness: Secondary | ICD-10-CM | POA: Diagnosis not present

## 2022-01-06 DIAGNOSIS — M9900 Segmental and somatic dysfunction of head region: Secondary | ICD-10-CM | POA: Diagnosis not present

## 2022-01-06 DIAGNOSIS — R2681 Unsteadiness on feet: Secondary | ICD-10-CM | POA: Diagnosis not present

## 2022-01-29 DIAGNOSIS — R2681 Unsteadiness on feet: Secondary | ICD-10-CM | POA: Diagnosis not present

## 2022-01-29 DIAGNOSIS — R42 Dizziness and giddiness: Secondary | ICD-10-CM | POA: Diagnosis not present

## 2022-01-29 DIAGNOSIS — M9901 Segmental and somatic dysfunction of cervical region: Secondary | ICD-10-CM | POA: Diagnosis not present

## 2022-01-29 DIAGNOSIS — M9900 Segmental and somatic dysfunction of head region: Secondary | ICD-10-CM | POA: Diagnosis not present

## 2022-01-30 DIAGNOSIS — M9900 Segmental and somatic dysfunction of head region: Secondary | ICD-10-CM | POA: Diagnosis not present

## 2022-01-30 DIAGNOSIS — M9901 Segmental and somatic dysfunction of cervical region: Secondary | ICD-10-CM | POA: Diagnosis not present

## 2022-01-30 DIAGNOSIS — R42 Dizziness and giddiness: Secondary | ICD-10-CM | POA: Diagnosis not present

## 2022-01-30 DIAGNOSIS — R2681 Unsteadiness on feet: Secondary | ICD-10-CM | POA: Diagnosis not present

## 2022-02-02 DIAGNOSIS — M9900 Segmental and somatic dysfunction of head region: Secondary | ICD-10-CM | POA: Diagnosis not present

## 2022-02-02 DIAGNOSIS — R2681 Unsteadiness on feet: Secondary | ICD-10-CM | POA: Diagnosis not present

## 2022-02-02 DIAGNOSIS — R42 Dizziness and giddiness: Secondary | ICD-10-CM | POA: Diagnosis not present

## 2022-02-02 DIAGNOSIS — M9901 Segmental and somatic dysfunction of cervical region: Secondary | ICD-10-CM | POA: Diagnosis not present

## 2022-02-03 DIAGNOSIS — R262 Difficulty in walking, not elsewhere classified: Secondary | ICD-10-CM | POA: Diagnosis not present

## 2022-02-03 DIAGNOSIS — R278 Other lack of coordination: Secondary | ICD-10-CM | POA: Diagnosis not present

## 2022-02-03 DIAGNOSIS — R42 Dizziness and giddiness: Secondary | ICD-10-CM | POA: Diagnosis not present

## 2022-02-05 DIAGNOSIS — M9900 Segmental and somatic dysfunction of head region: Secondary | ICD-10-CM | POA: Diagnosis not present

## 2022-02-05 DIAGNOSIS — R42 Dizziness and giddiness: Secondary | ICD-10-CM | POA: Diagnosis not present

## 2022-02-05 DIAGNOSIS — R2681 Unsteadiness on feet: Secondary | ICD-10-CM | POA: Diagnosis not present

## 2022-02-05 DIAGNOSIS — M9901 Segmental and somatic dysfunction of cervical region: Secondary | ICD-10-CM | POA: Diagnosis not present

## 2022-02-09 DIAGNOSIS — M9900 Segmental and somatic dysfunction of head region: Secondary | ICD-10-CM | POA: Diagnosis not present

## 2022-02-09 DIAGNOSIS — R2681 Unsteadiness on feet: Secondary | ICD-10-CM | POA: Diagnosis not present

## 2022-02-09 DIAGNOSIS — R262 Difficulty in walking, not elsewhere classified: Secondary | ICD-10-CM | POA: Diagnosis not present

## 2022-02-09 DIAGNOSIS — R42 Dizziness and giddiness: Secondary | ICD-10-CM | POA: Diagnosis not present

## 2022-02-09 DIAGNOSIS — R278 Other lack of coordination: Secondary | ICD-10-CM | POA: Diagnosis not present

## 2022-02-09 DIAGNOSIS — M9901 Segmental and somatic dysfunction of cervical region: Secondary | ICD-10-CM | POA: Diagnosis not present

## 2022-02-12 DIAGNOSIS — R2681 Unsteadiness on feet: Secondary | ICD-10-CM | POA: Diagnosis not present

## 2022-02-12 DIAGNOSIS — R42 Dizziness and giddiness: Secondary | ICD-10-CM | POA: Diagnosis not present

## 2022-02-12 DIAGNOSIS — M9900 Segmental and somatic dysfunction of head region: Secondary | ICD-10-CM | POA: Diagnosis not present

## 2022-02-12 DIAGNOSIS — M9901 Segmental and somatic dysfunction of cervical region: Secondary | ICD-10-CM | POA: Diagnosis not present

## 2022-02-18 DIAGNOSIS — R278 Other lack of coordination: Secondary | ICD-10-CM | POA: Diagnosis not present

## 2022-02-18 DIAGNOSIS — R262 Difficulty in walking, not elsewhere classified: Secondary | ICD-10-CM | POA: Diagnosis not present

## 2022-02-18 DIAGNOSIS — R42 Dizziness and giddiness: Secondary | ICD-10-CM | POA: Diagnosis not present

## 2022-02-23 DIAGNOSIS — R262 Difficulty in walking, not elsewhere classified: Secondary | ICD-10-CM | POA: Diagnosis not present

## 2022-02-23 DIAGNOSIS — R278 Other lack of coordination: Secondary | ICD-10-CM | POA: Diagnosis not present

## 2022-02-23 DIAGNOSIS — R42 Dizziness and giddiness: Secondary | ICD-10-CM | POA: Diagnosis not present

## 2022-02-23 DIAGNOSIS — R2681 Unsteadiness on feet: Secondary | ICD-10-CM | POA: Diagnosis not present

## 2022-02-23 DIAGNOSIS — M9901 Segmental and somatic dysfunction of cervical region: Secondary | ICD-10-CM | POA: Diagnosis not present

## 2022-02-23 DIAGNOSIS — M9900 Segmental and somatic dysfunction of head region: Secondary | ICD-10-CM | POA: Diagnosis not present

## 2022-02-26 DIAGNOSIS — R42 Dizziness and giddiness: Secondary | ICD-10-CM | POA: Diagnosis not present

## 2022-02-26 DIAGNOSIS — R2681 Unsteadiness on feet: Secondary | ICD-10-CM | POA: Diagnosis not present

## 2022-02-26 DIAGNOSIS — M9901 Segmental and somatic dysfunction of cervical region: Secondary | ICD-10-CM | POA: Diagnosis not present

## 2022-02-26 DIAGNOSIS — M9900 Segmental and somatic dysfunction of head region: Secondary | ICD-10-CM | POA: Diagnosis not present

## 2022-03-06 DIAGNOSIS — R42 Dizziness and giddiness: Secondary | ICD-10-CM | POA: Diagnosis not present

## 2022-03-06 DIAGNOSIS — R262 Difficulty in walking, not elsewhere classified: Secondary | ICD-10-CM | POA: Diagnosis not present

## 2022-03-06 DIAGNOSIS — R278 Other lack of coordination: Secondary | ICD-10-CM | POA: Diagnosis not present

## 2022-04-13 IMAGING — MG MM DIGITAL SCREENING BILAT W/ TOMO AND CAD
6 of 10 series · 6 of 30 positions shown · non-contrast
Comparison: Previous exam(s).

CLINICAL DATA: Screening.

EXAM:
DIGITAL SCREENING BILATERAL MAMMOGRAM WITH TOMOSYNTHESIS AND CAD
TECHNIQUE: Bilateral screening digital craniocaudal and mediolateral oblique
mammograms were obtained. Bilateral screening digital breast
tomosynthesis was performed. The images were evaluated with
computer-aided detection.

[L MLO synth-2D]
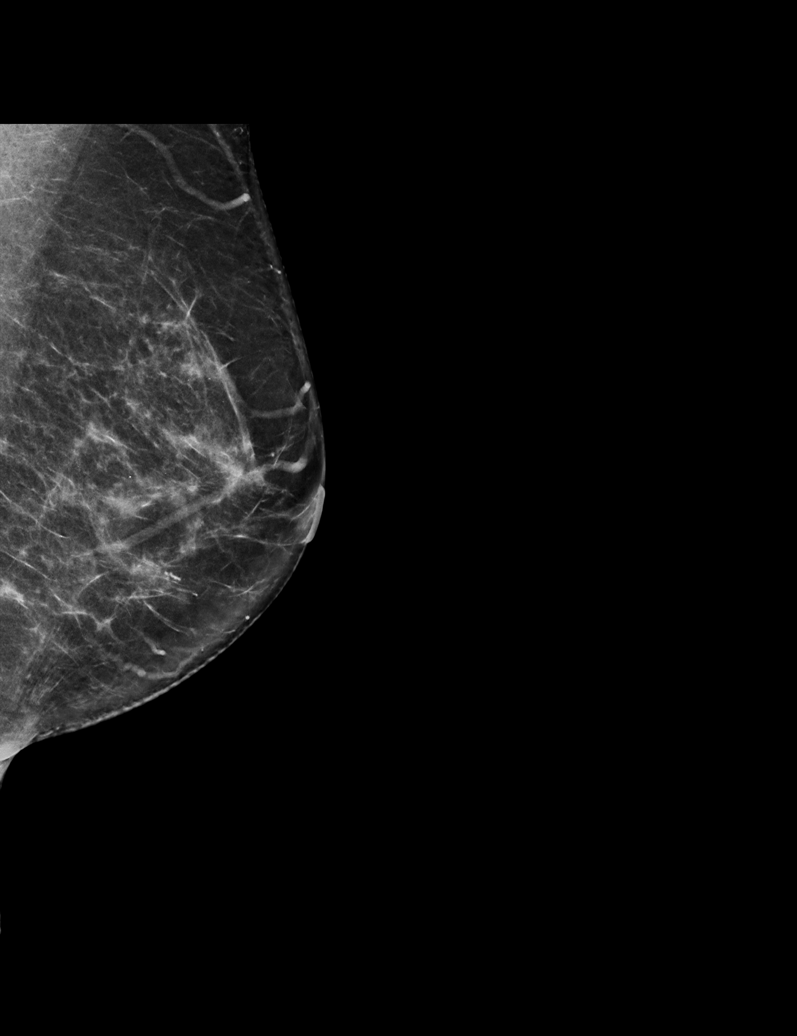

[R MLO synth-2D]
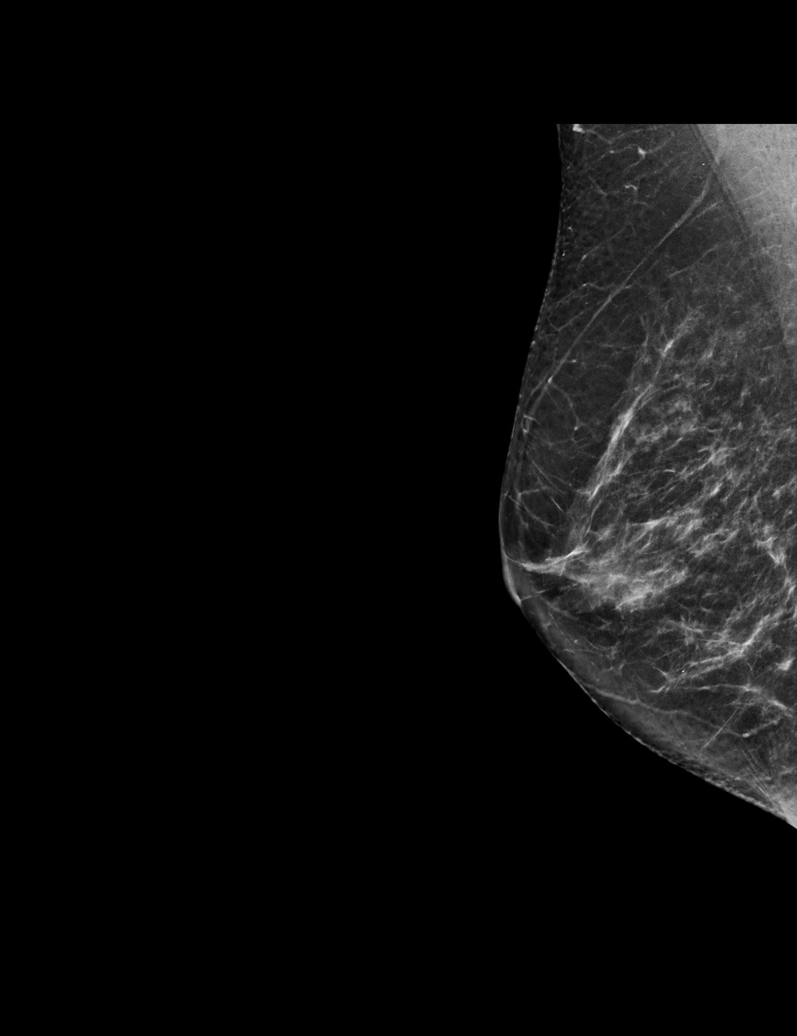

[R CC synth-2D (1 of 2)]
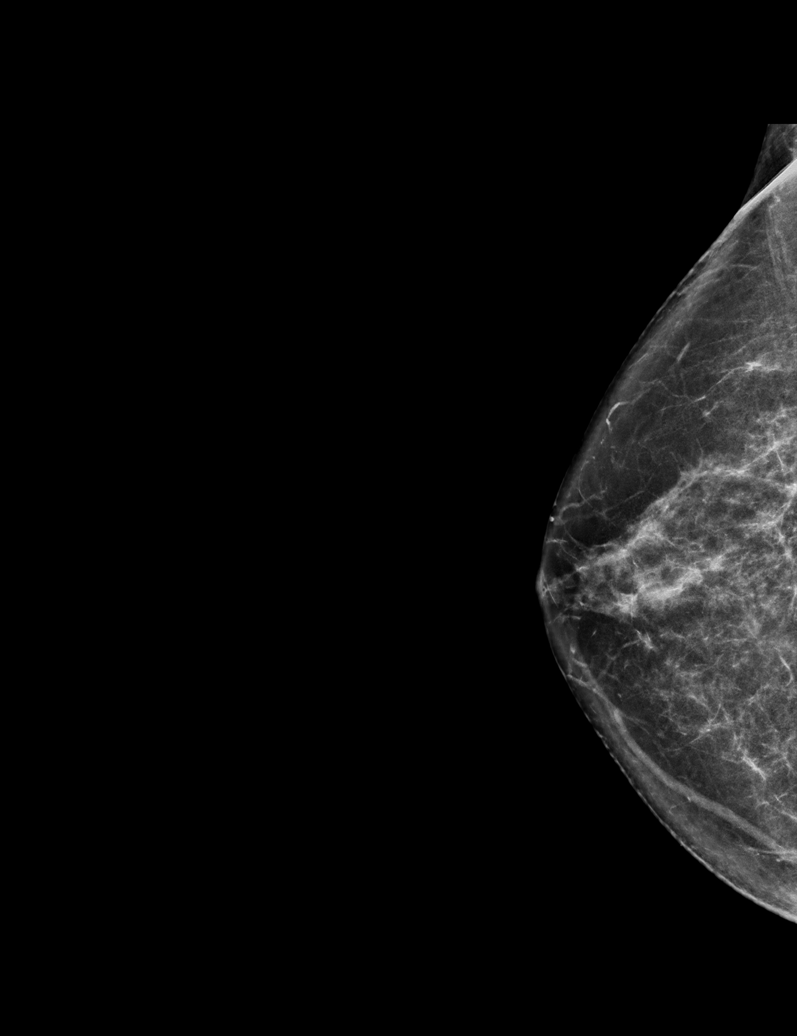

[L CC synth-2D]
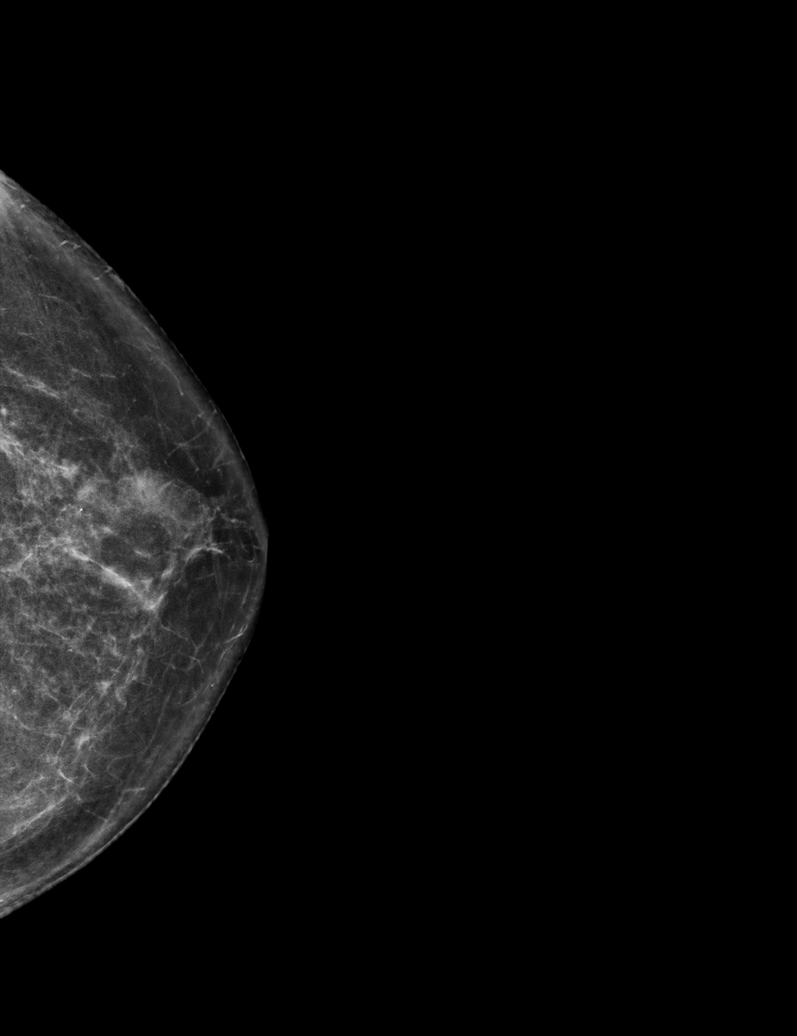

[R CC synth-2D (2 of 2)]
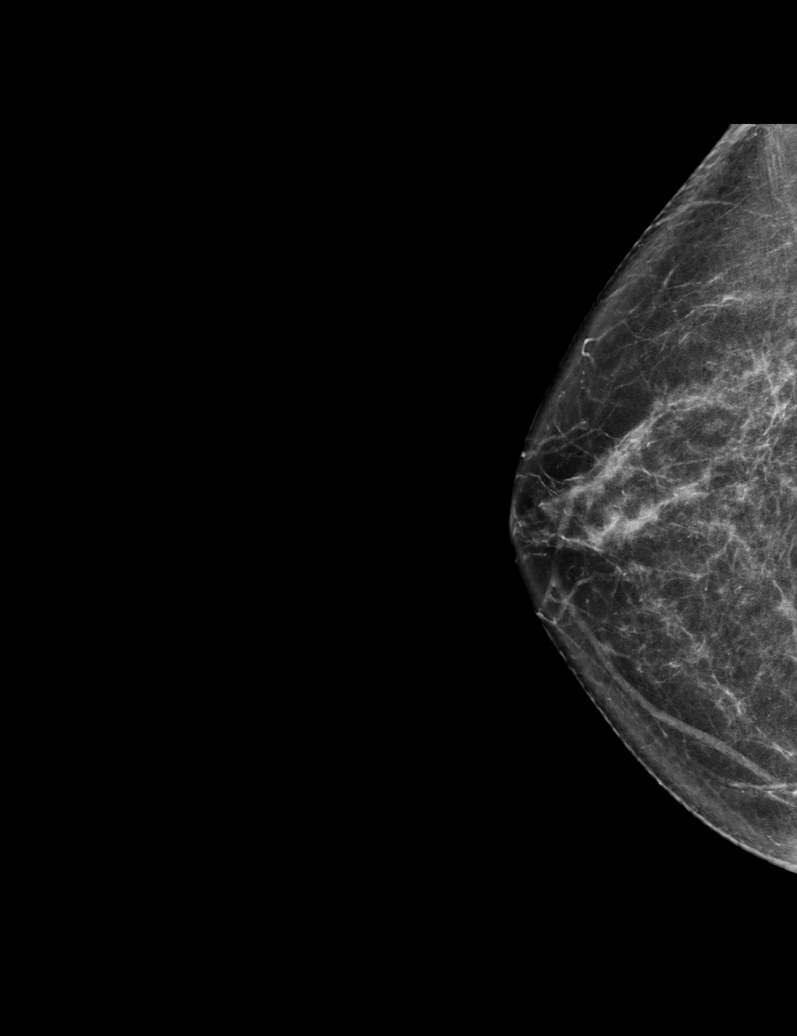

[R CC tomo · tomo slice 33/65.0]
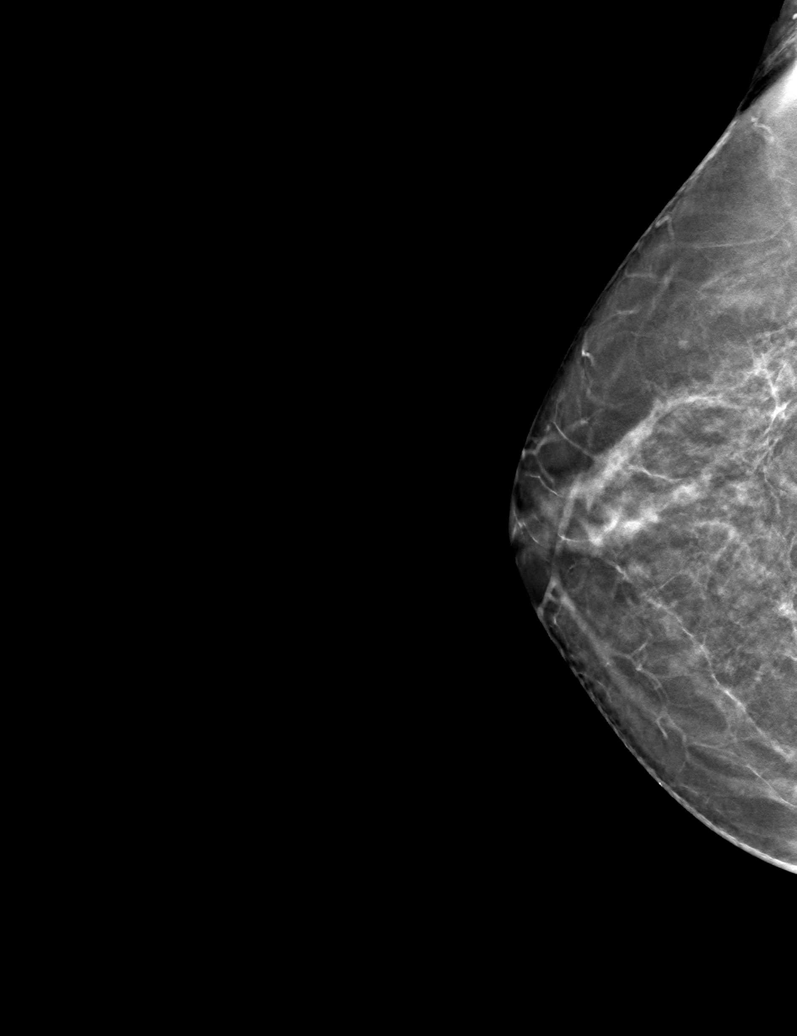

[6 of 30 positions shown; findings below may reference images not displayed]

ACR Breast Density Category b: There are scattered areas of
fibroglandular density.
FINDINGS: There are no findings suspicious for malignancy.
IMPRESSION: No mammographic evidence of malignancy. A result letter of this
screening mammogram will be mailed directly to the patient.

RECOMMENDATION:
Screening mammogram in one year. (Code:51-O-LD2)

BI-RADS CATEGORY  1: Negative.

## 2022-05-26 ENCOUNTER — Ambulatory Visit
Admission: RE | Admit: 2022-05-26 | Discharge: 2022-05-26 | Disposition: A | Discharge status: Home / Self Care | Payer: Federal, State, Local not specified - PPO | Attending: Gastroenterology | Admitting: Gastroenterology

## 2022-05-26 ENCOUNTER — Encounter
Admission: RE | Disposition: A | Discharge status: Home / Self Care | Payer: Self-pay | Source: Home / Self Care | Attending: Gastroenterology

## 2022-05-26 ENCOUNTER — Ambulatory Visit: Payer: Federal, State, Local not specified - PPO | Admitting: Anesthesiology

## 2022-05-26 ENCOUNTER — Encounter: Payer: Self-pay | Admitting: *Deleted

## 2022-05-26 ENCOUNTER — Other Ambulatory Visit: Payer: Self-pay

## 2022-05-26 DIAGNOSIS — E785 Hyperlipidemia, unspecified: Secondary | ICD-10-CM | POA: Insufficient documentation

## 2022-05-26 DIAGNOSIS — D122 Benign neoplasm of ascending colon: Secondary | ICD-10-CM | POA: Insufficient documentation

## 2022-05-26 DIAGNOSIS — Z1211 Encounter for screening for malignant neoplasm of colon: Secondary | ICD-10-CM | POA: Diagnosis present

## 2022-05-26 DIAGNOSIS — K6389 Other specified diseases of intestine: Secondary | ICD-10-CM | POA: Insufficient documentation

## 2022-05-26 DIAGNOSIS — K589 Irritable bowel syndrome without diarrhea: Secondary | ICD-10-CM | POA: Insufficient documentation

## 2022-05-26 DIAGNOSIS — R7303 Prediabetes: Secondary | ICD-10-CM | POA: Diagnosis not present

## 2022-05-26 DIAGNOSIS — K640 First degree hemorrhoids: Secondary | ICD-10-CM | POA: Diagnosis not present

## 2022-05-26 DIAGNOSIS — E039 Hypothyroidism, unspecified: Secondary | ICD-10-CM | POA: Diagnosis not present

## 2022-05-26 DIAGNOSIS — F32A Depression, unspecified: Secondary | ICD-10-CM | POA: Diagnosis not present

## 2022-05-26 DIAGNOSIS — G4730 Sleep apnea, unspecified: Secondary | ICD-10-CM | POA: Diagnosis not present

## 2022-05-26 DIAGNOSIS — D125 Benign neoplasm of sigmoid colon: Secondary | ICD-10-CM | POA: Insufficient documentation

## 2022-05-26 DIAGNOSIS — K5730 Diverticulosis of large intestine without perforation or abscess without bleeding: Secondary | ICD-10-CM | POA: Diagnosis not present

## 2022-05-26 HISTORY — PX: COLONOSCOPY WITH PROPOFOL: SHX5780

## 2022-05-26 SURGERY — COLONOSCOPY WITH PROPOFOL
Anesthesia: General

## 2022-05-26 MED ORDER — EPHEDRINE SULFATE (PRESSORS) 50 MG/ML IJ SOLN
INTRAMUSCULAR | Status: DC | PRN
  Administered 2022-05-26 (×2): 7.5 mg via INTRAVENOUS

## 2022-05-26 MED ORDER — KETAMINE HCL 10 MG/ML IJ SOLN
INTRAMUSCULAR | Status: DC | PRN
  Administered 2022-05-26: 20 mg via INTRAVENOUS

## 2022-05-26 MED ORDER — PROPOFOL 500 MG/50ML IV EMUL
INTRAVENOUS | Status: DC | PRN
  Administered 2022-05-26: 150 ug/kg/min via INTRAVENOUS

## 2022-05-26 MED ORDER — LIDOCAINE HCL (CARDIAC) PF 100 MG/5ML IV SOSY
PREFILLED_SYRINGE | INTRAVENOUS | Status: DC | PRN
  Administered 2022-05-26: 50 mg via INTRAVENOUS

## 2022-05-26 MED ORDER — MIDAZOLAM HCL 2 MG/2ML IJ SOLN
INTRAMUSCULAR | Status: DC | PRN
  Administered 2022-05-26: 2 mg via INTRAVENOUS

## 2022-05-26 MED ORDER — SODIUM CHLORIDE 0.9 % IV SOLN: INTRAVENOUS | Status: DC

## 2022-05-26 MED ORDER — KETAMINE HCL 50 MG/5ML IJ SOSY
PREFILLED_SYRINGE | INTRAMUSCULAR | Status: AC
  Filled 2022-05-26: qty 5

## 2022-05-26 MED ORDER — SODIUM CHLORIDE 0.9 % IV SOLN: INTRAVENOUS | Status: DC | PRN

## 2022-05-26 MED ORDER — MIDAZOLAM HCL 2 MG/2ML IJ SOLN
INTRAMUSCULAR | Status: AC
  Filled 2022-05-26: qty 2

## 2022-05-26 MED ORDER — PROPOFOL 10 MG/ML IV BOLUS
INTRAVENOUS | Status: DC | PRN
  Administered 2022-05-26: 50 mg via INTRAVENOUS

## 2022-05-26 MED ORDER — PHENYLEPHRINE HCL (PRESSORS) 10 MG/ML IV SOLN
INTRAVENOUS | Status: DC | PRN
  Administered 2022-05-26: 160 ug via INTRAVENOUS

## 2022-05-26 NOTE — Op Note (Signed)
[facility] Gastroenterology Patient Name: Shelly Kim Procedure Date: 05/26/2022 8:29 AM MRN: [id number] Account #: [id number] Date of Birth: 24-Apr-1956 Admit Type: Outpatient Age: 66 Room: [facility] ENDO ROOM 1 Gender: Female Note Status: Finalized Instrument Name: Jasper Riling [id number] Procedure:             Colonoscopy Indications:           Screening for colorectal malignant neoplasm Providers:             Andrey Farmer MD, MD Medicines:             Monitored Anesthesia Care Complications:         No immediate complications. Estimated blood loss:                         Minimal. Procedure:             Pre-Anesthesia Assessment:                        - Prior to the procedure, a History and Physical was                         performed, and patient medications and allergies were                         reviewed. The patient is competent. The risks and                         benefits of the procedure and the sedation options and                         risks were discussed with the patient. All questions                         were answered and informed consent was obtained.                         Patient identification and proposed procedure were                         verified by the physician, the nurse, the                         anesthesiologist, the anesthetist and the technician                         in the endoscopy suite. Mental Status Examination:                         alert and oriented. Airway Examination: normal                         oropharyngeal airway and neck mobility. Respiratory                         Examination: clear to auscultation. CV Examination:                         normal. Prophylactic Antibiotics: The patient does not  require prophylactic antibiotics. Prior                         Anticoagulants: The patient has taken no anticoagulant                         or antiplatelet agents. ASA Grade  Assessment: II - A                         patient with mild systemic disease. After reviewing                         the risks and benefits, the patient was deemed in                         satisfactory condition to undergo the procedure. The                         anesthesia plan was to use monitored anesthesia care                         (MAC). Immediately prior to administration of                         medications, the patient was re-assessed for adequacy                         to receive sedatives. The heart rate, respiratory                         rate, oxygen saturations, blood pressure, adequacy of                         pulmonary ventilation, and response to care were                         monitored throughout the procedure. The physical                         status of the patient was re-assessed after the                         procedure.                        After obtaining informed consent, the colonoscope was                         passed under direct vision. Throughout the procedure,                         the patient's blood pressure, pulse, and oxygen                         saturations were monitored continuously. The                         Colonoscope was introduced through the anus and  advanced to the the cecum, identified by appendiceal                         orifice and ileocecal valve. The colonoscopy was                         performed without difficulty. The patient tolerated                         the procedure well. The quality of the bowel                         preparation was good. The ileocecal valve, appendiceal                         orifice, and rectum were photographed. Findings:      The perianal and digital rectal examinations were normal.      A 2 mm polyp was found in the ascending colon. The polyp was sessile.       The polyp was removed with a cold snare. Resection and retrieval were       complete.  Estimated blood loss was minimal.      A patchy area of mildly erythematous mucosa was found in the sigmoid       colon. This was in between diverticula so could be mild SCAD vs prep       artifact. Biopsies were taken with a cold forceps for histology.       Estimated blood loss was minimal.      A few small-mouthed diverticula were found in the sigmoid colon.      A 1 mm polyp was found in the sigmoid colon. The polyp was sessile. The       polyp was removed with a cold biopsy forceps. Resection and retrieval       were complete. Estimated blood loss was minimal.      Internal hemorrhoids were found during retroflexion. The hemorrhoids       were Grade I (internal hemorrhoids that do not prolapse).      The exam was otherwise without abnormality on direct and retroflexion       views. Impression:            - One 2 mm polyp in the ascending colon, removed with                         a cold snare. Resected and retrieved.                        - Erythematous mucosa in the sigmoid colon. Biopsied.                        - Diverticulosis in the sigmoid colon.                        - One 1 mm polyp in the sigmoid colon, removed with a                         cold biopsy forceps. Resected and retrieved.                        -  Internal hemorrhoids.                        - The examination was otherwise normal on direct and                         retroflexion views. Recommendation:        - Discharge patient to home.                        - Resume previous diet.                        - Continue present medications.                        - Await pathology results.                        - Repeat colonoscopy for surveillance based on                         pathology results.                        - Return to referring physician as previously                         scheduled. Procedure Code(s):     --- Professional ---                        [phone], Colonoscopy, flexible; with  removal of                         tumor(s), polyp(s), or other lesion(s) by snare                         technique                        45380, 5, Colonoscopy, flexible; with biopsy, single                         or multiple Diagnosis Code(s):     --- Professional ---                        Z12.11, Encounter for screening for malignant neoplasm                         of colon                        D12.2, Benign neoplasm of ascending colon                        D12.5, Benign neoplasm of sigmoid colon                        K63.89, Other specified diseases of intestine                        K64.0, First degree hemorrhoids  K57.30, Diverticulosis of large intestine without                         perforation or abscess without bleeding CPT copyright 2022 American Medical Association. All rights reserved. The codes documented in this report are preliminary and upon coder review may  be revised to meet current compliance requirements. Andrey Farmer MD, MD 05/26/2022 9:15:03 AM Number of Addenda: 0 Note Initiated On: 05/26/2022 8:29 AM Scope Withdrawal Time: 0 hours 11 minutes 12 seconds  Total Procedure Duration: 0 hours 16 minutes 10 seconds  Estimated Blood Loss:  Estimated blood loss was minimal.      [facility]

## 2022-05-26 NOTE — Anesthesia Preprocedure Evaluation (Addendum)
Anesthesia Evaluation  Patient identified by MRN, date of birth, ID band Patient awake    Reviewed: Allergy & Precautions, NPO status , Patient's Chart, lab work & pertinent test results  Airway Mallampati: II  TM Distance: >3 FB Neck ROM: full    Dental  (+) Chipped,    Pulmonary sleep apnea    Pulmonary exam normal        Cardiovascular negative cardio ROS Normal cardiovascular exam     Neuro/Psych  PSYCHIATRIC DISORDERS  Depression    Muscle weakness of upper extremity 2018- Nerve conduction studies done on both upper extremities were within normal limits.  No evidence of a neuropathy is seen.  EMG of the right upper extremity was unremarkable without evidence of a radiculopathy or a myopathic disorder.    GI/Hepatic negative GI ROS, Neg liver ROS,,,  Endo/Other  H/o Autoimmune thyroiditis    Renal/GU negative Renal ROS  negative genitourinary   Musculoskeletal   Abdominal Normal abdominal exam  (+)   Peds  Hematology negative hematology ROS (+)   Anesthesia Other Findings Past Medical History: No date: Hyperlipidemia depression: Mental disorder No date: Prediabetes  Past Surgical History: No date: TONSILLECTOMY No date: VAGINAL DELIVERY     Comment:  3     Reproductive/Obstetrics negative OB ROS                             Anesthesia Physical Anesthesia Plan  ASA: 2  Anesthesia Plan: General   Post-op Pain Management:    Induction:   PONV Risk Score and Plan: Propofol infusion and TIVA  Airway Management Planned: Natural Airway  Additional Equipment:   Intra-op Plan:   Post-operative Plan:   Informed Consent: I have reviewed the patients History and Physical, chart, labs and discussed the procedure including the risks, benefits and alternatives for the proposed anesthesia with the patient or authorized representative who has indicated his/her understanding and  acceptance.     Dental Advisory Given  Plan Discussed with: Anesthesiologist, CRNA and Surgeon  Anesthesia Plan Comments:         Anesthesia Quick Evaluation

## 2022-05-26 NOTE — Transfer of Care (Signed)
Immediate Anesthesia Transfer of Care Note  Patient: Shelly Kim  Procedure(s) Performed: COLONOSCOPY WITH PROPOFOL  Patient Location: PACU  Anesthesia Type:General  Level of Consciousness: drowsy  Airway & Oxygen Therapy: Patient Spontanous Breathing  Post-op Assessment: Report given to RN and Post -op Vital signs reviewed and stable  Post vital signs: Reviewed and stable  Last Vitals:  Vitals Value Taken Time  BP 146/95 05/26/22 0910  Temp    Pulse 85 05/26/22 0908  Resp 17 05/26/22 0908  SpO2 97 % 05/26/22 0908    Last Pain:  Vitals:   05/26/22 0908  TempSrc:   PainSc: Asleep         Complications: No notable events documented.

## 2022-05-26 NOTE — Interval H&P Note (Signed)
History and Physical Interval Note:  05/26/2022 8:38 AM  Shelly Kim  has presented today for surgery, with the diagnosis of Colon Cancer Screening.  The various methods of treatment have been discussed with the patient and family. After consideration of risks, benefits and other options for treatment, the patient has consented to  Procedure(s): COLONOSCOPY WITH PROPOFOL (N/A) as a surgical intervention.  The patient's history has been reviewed, patient examined, no change in status, stable for surgery.  I have reviewed the patient's chart and labs.  Questions were answered to the patient's satisfaction.     [name]  Ok to proceed with colonoscopy

## 2022-05-26 NOTE — H&P (Signed)
Outpatient short stay form Pre-procedure 05/26/2022  [name], MD  Primary Physician: Leeroy Cha, MD  Reason for visit:  Screening colonoscopy  History of present illness:    66 y/o lady with history of hypothyroidism and IBS here for screening colonoscopy. Had normal colonoscopy about 10 years ago. No blood thinners. No family history of GI malignancies. No abdominal surgeries.    Current Facility-Administered Medications:    0.9 %  sodium chloride infusion, , Intravenous, Continuous, Marshae Azam, Hilton Cork, MD  Facility-Administered Medications Ordered in Other Encounters:    gadopentetate dimeglumine (MAGNEVIST) injection 15 mL, 15 mL, Intravenous, Once PRN, Dohmeier, Asencion Partridge, MD  Medications Prior to Admission  Medication Sig Dispense Refill Last Dose   Cholecalciferol (VITAMIN D3) 2000 UNITS TABS Take by mouth.      Lactase (LACTOSE INTOLERANCE PO) Take by mouth daily.      Multiple Vitamin (MULTIVITAMIN) tablet Take 1 tablet by mouth daily.      Omega-3 Fatty Acids (FISH OIL) 1000 MG CAPS Take 1 capsule by mouth daily.      Probiotic Product (PROBIOTIC DAILY PO) Take by mouth.      Thyroid (LEVOTHYROXINE-LIOTHYRONINE) 15 MG TABS TK 1 T PO QD (Patient not taking: Reported on 05/26/2022)  4 Not Taking     Allergies  Allergen Reactions   Cymbalta [Duloxetine Hcl]     angioedema   Penicillins      Past Medical History:  Diagnosis Date   Hyperlipidemia    Mental disorder depression   Prediabetes     Review of systems:  Otherwise negative.    Physical Exam  Gen: Alert, oriented. Appears stated age.  HEENT: PERRLA. Lungs: No respiratory distress CV: RRR Abd: soft, benign, no masses Ext: No edema    Planned procedures: Proceed with colonoscopy. The patient understands the nature of the planned procedure, indications, risks, alternatives and potential complications including but not limited to bleeding, infection, perforation, damage to  internal organs and possible oversedation/side effects from anesthesia. The patient agrees and gives consent to proceed.  Please refer to procedure notes for findings, recommendations and patient disposition/instructions.     [name], MD [facility]

## 2022-05-27 ENCOUNTER — Encounter: Payer: Self-pay | Admitting: Gastroenterology

## 2022-05-27 LAB — SURGICAL PATHOLOGY

## 2022-05-27 NOTE — Anesthesia Postprocedure Evaluation (Signed)
Anesthesia Post Note  Patient: Shelly Kim  Procedure(s) Performed: COLONOSCOPY WITH PROPOFOL  Patient location during evaluation: PACU Anesthesia Type: General Level of consciousness: awake and alert Pain management: pain level controlled Vital Signs Assessment: post-procedure vital signs reviewed and stable Respiratory status: spontaneous breathing, nonlabored ventilation and respiratory function stable Cardiovascular status: blood pressure returned to baseline and stable Postop Assessment: no apparent nausea or vomiting Anesthetic complications: no   No notable events documented.   Last Vitals:  Vitals:   05/26/22 0920 05/26/22 0931  BP: 101/69 (!) 112/56  Pulse: 86 81  Resp: 18 10  Temp:    SpO2: 100% 100%    Last Pain:  Vitals:   05/27/22 0750  TempSrc:   PainSc: 0-No pain                 [place] Ouch

## 2022-07-10 ENCOUNTER — Other Ambulatory Visit: Payer: Self-pay | Admitting: Internal Medicine

## 2022-07-10 DIAGNOSIS — H538 Other visual disturbances: Secondary | ICD-10-CM

## 2022-07-10 DIAGNOSIS — R42 Dizziness and giddiness: Secondary | ICD-10-CM

## 2022-07-20 ENCOUNTER — Ambulatory Visit
Admission: RE | Admit: 2022-07-20 | Discharge: 2022-07-20 | Disposition: A | Discharge status: Home / Self Care | Payer: Federal, State, Local not specified - PPO | Source: Ambulatory Visit | Attending: Internal Medicine | Admitting: Internal Medicine

## 2022-07-20 DIAGNOSIS — R42 Dizziness and giddiness: Secondary | ICD-10-CM

## 2022-07-20 DIAGNOSIS — H538 Other visual disturbances: Secondary | ICD-10-CM

## 2022-09-08 ENCOUNTER — Other Ambulatory Visit: Payer: Self-pay | Admitting: Internal Medicine

## 2022-09-08 ENCOUNTER — Ambulatory Visit
Admission: RE | Admit: 2022-09-08 | Discharge: 2022-09-08 | Disposition: A | Discharge status: Home / Self Care | Payer: Medicare Other | Source: Ambulatory Visit | Attending: Internal Medicine | Admitting: Internal Medicine

## 2022-09-08 DIAGNOSIS — M25552 Pain in left hip: Secondary | ICD-10-CM

## 2023-06-17 ENCOUNTER — Emergency Department
Admission: EM | Admit: 2023-06-17 | Discharge: 2023-06-17 | Disposition: A | Discharge status: Home / Self Care | Payer: Medicare HMO | Attending: Emergency Medicine | Admitting: Emergency Medicine

## 2023-06-17 ENCOUNTER — Emergency Department: Payer: Medicare HMO

## 2023-06-17 ENCOUNTER — Other Ambulatory Visit: Payer: Self-pay

## 2023-06-17 ENCOUNTER — Encounter: Payer: Self-pay | Admitting: Emergency Medicine

## 2023-06-17 DIAGNOSIS — R079 Chest pain, unspecified: Secondary | ICD-10-CM

## 2023-06-17 DIAGNOSIS — R0789 Other chest pain: Secondary | ICD-10-CM | POA: Insufficient documentation

## 2023-06-17 LAB — BASIC METABOLIC PANEL
Anion gap: 8 (ref 5–15)
BUN: 15 mg/dL (ref 8–23)
CO2: 26 mmol/L (ref 22–32)
Calcium: 9.7 mg/dL (ref 8.9–10.3)
Chloride: 106 mmol/L (ref 98–111)
Creatinine, Ser: 0.46 mg/dL (ref 0.44–1.00)
GFR, Estimated: >60 mL/min (ref >60)
Glucose, Bld: 115 mg/dL — ABNORMAL HIGH (ref 70–99)
Potassium: 3.3 mmol/L — ABNORMAL LOW (ref 3.5–5.1)
Sodium: 140 mmol/L (ref 135–145)

## 2023-06-17 LAB — CBC
HCT: 41.9 % (ref 36.0–46.0)
Hemoglobin: 13.5 g/dL (ref 12.0–15.0)
MCH: 29.3 pg (ref 26.0–34.0)
MCHC: 32.2 g/dL (ref 30.0–36.0)
MCV: 91.1 fL (ref 80.0–100.0)
Platelets: 261 10*3/uL (ref 150–400)
RBC: 4.6 MIL/uL (ref 3.87–5.11)
RDW: 13.4 % (ref 11.5–15.5)
WBC: 12 10*3/uL — ABNORMAL HIGH (ref 4.0–10.5)
nRBC: 0 % (ref 0.0–0.2)

## 2023-06-17 LAB — TROPONIN I (HIGH SENSITIVITY)
Troponin I (High Sensitivity): 4 ng/L (ref <18)
Troponin I (High Sensitivity): 4 ng/L (ref <18)

## 2023-06-17 NOTE — Discharge Instructions (Addendum)
Please continue taking one pepcid every morning for the next 5 days.

## 2023-06-17 NOTE — ED Triage Notes (Signed)
Patient to ED via POV for centralized CP. States squeezing pain around bra strap line- started while more things around. Denies cardiac hx.

## 2023-06-17 NOTE — ED Provider Notes (Signed)
[facility] Provider Note   Event Date/Time   First MD Initiated Contact with Patient 06/17/23 1738     (approximate) History  Chest Pain  HPI Shelly Kim is a 67 y.o. female with a past medical history of prediabetes and hyperlipidemia who presents complaining of centralized chest pressure that she describes as a squeezing tightness around her xiphoid process.  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  Patient states that since this pain has begun she has tried taking Pepcid which has improved her symptoms ROS: Patient currently denies any vision changes, tinnitus, difficulty speaking, facial droop, sore throat, chest pain, shortness of breath, abdominal pain, nausea/vomiting/diarrhea, dysuria, or weakness/numbness/paresthesias in any extremity   Physical Exam  Triage Vital Signs: ED Triage Vitals [06/17/23 1610]  Encounter Vitals Group     BP (!) 125/92     Systolic BP Percentile      Diastolic BP Percentile      Pulse Rate (!) 110     Resp 18     Temp 98.2 F (36.8 C)     Temp Source Oral     SpO2 98 %     Weight 175 lb (79.4 kg)     Height 5\' 7"  (1.702 m)     Head Circumference      Peak Flow      Pain Score 1     Pain Loc      Pain Education      Exclude from Growth Chart    Most recent vital signs: Vitals:   06/17/23 1610  BP: (!) 125/92  Pulse: (!) 110  Resp: 18  Temp: 98.2 F (36.8 C)  SpO2: 98%   General: Awake, oriented x4. CV:  Good peripheral perfusion.  Resp:  Normal effort.  Abd:  No distention.  Other:  Elderly overweight Caucasian female resting comfortably in no acute distress ED Results / Procedures / Treatments  Labs (all labs ordered are listed, but only abnormal results are displayed) Labs Reviewed  BASIC METABOLIC PANEL - Abnormal; Notable for the following components:      Result Value   Potassium 3.3 (*)    Glucose, Bld 115 (*)    All other  components within normal limits  CBC - Abnormal; Notable for the following components:   WBC 12.0 (*)    All other components within normal limits  TROPONIN I (HIGH SENSITIVITY)  TROPONIN I (HIGH SENSITIVITY)   EKG ED ECG REPORT I, Merwyn Katos, the attending physician, personally viewed and interpreted this ECG. Date: 06/17/2023 EKG Time: 1604 Rate: 106 Rhythm: Sinus tachycardia QRS Axis: normal Intervals: normal ST/T Wave abnormalities: normal Narrative Interpretation: Sinus tachycardia.  No evidence of acute ischemia RADIOLOGY ED MD interpretation: 2 view chest x-ray interpreted by me shows no evidence of acute abnormalities including no pneumonia, pneumothorax, or widened mediastinum -Agree with radiology assessment Official radiology report(s): DG Chest 2 View Result Date: 06/17/2023 CLINICAL DATA:  cp EXAM: CHEST - 2 VIEW COMPARISON:  None Available. FINDINGS: Cardiac silhouette is unremarkable. No pneumothorax or pleural effusion. The lungs are clear. The visualized skeletal structures are unremarkable. IMPRESSION: No acute cardiopulmonary process. Electronically Signed   By: Layla Maw M.D.   On: 06/17/2023 19:37   PROCEDURES: Critical Care performed: No Procedures MEDICATIONS ORDERED IN ED: Medications - No data to display IMPRESSION / MDM / ASSESSMENT AND PLAN / ED COURSE  I reviewed  the triage vital signs and the nursing notes.                             The patient is on the cardiac monitor to evaluate for evidence of arrhythmia and/or significant heart rate changes. Patient's presentation is most consistent with acute presentation with potential threat to life or bodily function. 67 year old female presents with subxiphoid chest pain that began after physical exertion today Workup: ECG, CXR, CBC, BMP, Troponin Findings: ECG: No overt evidence of STEMI. No evidence of Brugada's sign, delta wave, epsilon wave, significantly prolonged QTc, or malignant  arrhythmia HS Troponin: Negative x1 Other Labs unremarkable for emergent problems. CXR: Without PTX, PNA, or widened mediastinum Last Stress Test: Denies Last Heart Catheterization: Denies HEART Score: 3  Given History, Exam, and Workup I have low suspicion for ACS, Pneumothorax, Pneumonia, Pulmonary Embolus, Tamponade, Aortic Dissection or other emergent problem as a cause for this presentation.   Reassesment: Prior to discharge patient's pain was controlled and they were well appearing.  Disposition:  Discharge. Strict return precautions discussed with patient with full understanding. Advised patient to follow up promptly with primary care provider    FINAL CLINICAL IMPRESSION(S) / ED DIAGNOSES   Final diagnoses:  Nonspecific chest pain   Rx / DC Orders   ED Discharge Orders          Ordered    Ambulatory referral to Cardiology       Comments: If you have not heard from the Cardiology office within the next 72 hours please call [phone].   06/17/23 1844           Note:  This document was prepared using Dragon voice recognition software and may include unintentional dictation errors.   Merwyn Katos, MD 06/17/23 [id number]

## 2023-06-28 ENCOUNTER — Other Ambulatory Visit: Payer: Self-pay | Admitting: Internal Medicine

## 2023-06-28 DIAGNOSIS — Z1231 Encounter for screening mammogram for malignant neoplasm of breast: Secondary | ICD-10-CM

## 2023-07-14 ENCOUNTER — Ambulatory Visit
Admission: RE | Admit: 2023-07-14 | Discharge: 2023-07-14 | Disposition: A | Discharge status: Home / Self Care | Payer: Medicare HMO | Source: Ambulatory Visit

## 2023-07-14 DIAGNOSIS — Z1231 Encounter for screening mammogram for malignant neoplasm of breast: Secondary | ICD-10-CM

## 2024-02-04 ENCOUNTER — Other Ambulatory Visit: Payer: Self-pay | Admitting: Internal Medicine

## 2024-02-04 DIAGNOSIS — Z13820 Encounter for screening for osteoporosis: Secondary | ICD-10-CM
